# Patient Record
Sex: Male | Born: 1955 | Race: White | Hispanic: No | Marital: Married | Smoking: Former smoker
Health system: Southern US, Community
[De-identification: ages and names within clinical notes are randomized; demographics above are authoritative.]

## PROBLEM LIST (undated history)

## (undated) DIAGNOSIS — T7500XA Unspecified effects of lightning, initial encounter: Secondary | ICD-10-CM

## (undated) DIAGNOSIS — Z86010 Personal history of colonic polyps: Secondary | ICD-10-CM

## (undated) DIAGNOSIS — K219 Gastro-esophageal reflux disease without esophagitis: Secondary | ICD-10-CM

## (undated) DIAGNOSIS — R7989 Other specified abnormal findings of blood chemistry: Secondary | ICD-10-CM

## (undated) DIAGNOSIS — K5731 Diverticulosis of large intestine without perforation or abscess with bleeding: Secondary | ICD-10-CM

## (undated) DIAGNOSIS — G8929 Other chronic pain: Secondary | ICD-10-CM

## (undated) DIAGNOSIS — R51 Headache: Secondary | ICD-10-CM

## (undated) DIAGNOSIS — L03011 Cellulitis of right finger: Secondary | ICD-10-CM

## (undated) DIAGNOSIS — L0390 Cellulitis, unspecified: Secondary | ICD-10-CM

## (undated) DIAGNOSIS — B9562 Methicillin resistant Staphylococcus aureus infection as the cause of diseases classified elsewhere: Secondary | ICD-10-CM

## (undated) DIAGNOSIS — K922 Gastrointestinal hemorrhage, unspecified: Secondary | ICD-10-CM

## (undated) DIAGNOSIS — M1990 Unspecified osteoarthritis, unspecified site: Secondary | ICD-10-CM

## (undated) DIAGNOSIS — M5030 Other cervical disc degeneration, unspecified cervical region: Secondary | ICD-10-CM

## (undated) DIAGNOSIS — I10 Essential (primary) hypertension: Secondary | ICD-10-CM

## (undated) DIAGNOSIS — R519 Headache, unspecified: Secondary | ICD-10-CM

## (undated) HISTORY — DX: Other specified abnormal findings of blood chemistry: R79.89

## (undated) HISTORY — DX: Gastro-esophageal reflux disease without esophagitis: K21.9

## (undated) HISTORY — DX: Other cervical disc degeneration, unspecified cervical region: M50.30

## (undated) HISTORY — DX: Diverticulosis of large intestine without perforation or abscess with bleeding: K57.31

## (undated) HISTORY — DX: Headache: R51

## (undated) HISTORY — DX: Unspecified effects of lightning, initial encounter: T75.00XA

## (undated) HISTORY — DX: Headache, unspecified: R51.9

## (undated) HISTORY — DX: Other chronic pain: G89.29

## (undated) HISTORY — DX: Gastrointestinal hemorrhage, unspecified: K92.2

## (undated) HISTORY — DX: Personal history of colonic polyps: Z86.010

## (undated) HISTORY — DX: Cellulitis of right finger: L03.011

---

## 2009-03-15 DIAGNOSIS — M5030 Other cervical disc degeneration, unspecified cervical region: Secondary | ICD-10-CM

## 2009-03-15 HISTORY — DX: Other cervical disc degeneration, unspecified cervical region: M50.30

## 2009-04-21 ENCOUNTER — Encounter: Admission: RE | Admit: 2009-04-21 | Discharge: 2009-04-21 | Payer: Self-pay | Admitting: Family Medicine

## 2009-10-25 ENCOUNTER — Emergency Department (HOSPITAL_COMMUNITY): Admission: EM | Admit: 2009-10-25 | Discharge: 2009-10-25 | Payer: Self-pay | Admitting: Emergency Medicine

## 2010-02-20 ENCOUNTER — Emergency Department (HOSPITAL_COMMUNITY)
Admission: EM | Admit: 2010-02-20 | Discharge: 2010-02-20 | Payer: Self-pay | Source: Home / Self Care | Admitting: Emergency Medicine

## 2010-08-24 ENCOUNTER — Encounter: Payer: Self-pay | Admitting: Internal Medicine

## 2010-08-24 ENCOUNTER — Ambulatory Visit (INDEPENDENT_AMBULATORY_CARE_PROVIDER_SITE_OTHER): Payer: 59 | Admitting: Internal Medicine

## 2010-08-24 VITALS — BP 138/76 | HR 84 | Ht 69.0 in | Wt 164.0 lb

## 2010-08-24 DIAGNOSIS — L290 Pruritus ani: Secondary | ICD-10-CM

## 2010-08-24 DIAGNOSIS — R1319 Other dysphagia: Secondary | ICD-10-CM

## 2010-08-24 DIAGNOSIS — K219 Gastro-esophageal reflux disease without esophagitis: Secondary | ICD-10-CM

## 2010-08-24 DIAGNOSIS — Z1211 Encounter for screening for malignant neoplasm of colon: Secondary | ICD-10-CM

## 2010-08-24 MED ORDER — PEG-KCL-NACL-NASULF-NA ASC-C 100 G PO SOLR: 1.0000 | Freq: Once | ORAL | Status: DC

## 2010-08-24 NOTE — Progress Notes (Signed)
  Subjective:    Patient ID: John Johnston, male    DOB: Dec 15, 1955, 55 y.o.   MRN: [id number]  HPI 55 yo married white man here with dysphagia and suspected GERD. On Prilosec OTC bid x 2 years since brother had same issues with heartburn and was prescribed PPI. Has intermittent solid dysphagia, epigastric sticking point and waits it out until it passes but has regurgitated. Dysphagia x months No heartburn on prilosec Also has intermittent hoarseness hemorrhods x years, itching mainly  Here with wife -ICU RN and I care for her dad also  Review of Systems  HENT: Positive for hearing loss.   Musculoskeletal: Positive for back pain.  Sleep is disturbed by tinnitus   All other ROS negative or as per HPI Objective:   Physical Exam  Constitutional: He is oriented to person, place, and time. He appears well-developed and well-nourished. No distress.  HENT:  Head: Normocephalic.  Mouth/Throat: Oropharynx is clear and moist. No oropharyngeal exudate.       Partial dentures  Eyes: Conjunctivae are normal. Pupils are equal, round, and reactive to light. No scleral icterus.  Neck: Normal range of motion. Neck supple. No tracheal deviation present. No thyromegaly present.  Cardiovascular: Normal rate and regular rhythm.  Exam reveals no gallop and no friction rub.   No murmur heard. Pulmonary/Chest: Effort normal and breath sounds normal.  Abdominal: Soft. Bowel sounds are normal. He exhibits no distension and no mass. There is no tenderness.       No HSM, BS +  Genitourinary:       Rectal deferred  Lymphadenopathy:    He has no cervical adenopathy.  Neurological: He is alert and oriented to person, place, and time.  Skin: Skin is warm and dry.       Tanned sun-exposed areas  Psychiatric: He has a normal mood and affect. His behavior is normal.          Assessment & Plan:

## 2010-08-24 NOTE — Assessment & Plan Note (Signed)
Average risk Colonoscopy Risks/benefits discussed.

## 2010-08-24 NOTE — Assessment & Plan Note (Signed)
Probably hemorrhoids. Will examine at colonoscopy. Hygiene and pramoxine advised.

## 2010-08-24 NOTE — Assessment & Plan Note (Signed)
History compatible - heartburn x years, none on Prilosec. Now with dysphagia so needs EGD.

## 2010-08-24 NOTE — Assessment & Plan Note (Signed)
Months of intermittent solid dysphagia. EGD and likely dilation. Risks and benefits explained.

## 2010-08-24 NOTE — Patient Instructions (Addendum)
Try a lotion or cream with pramoxine for the anal itching in addition to the drying techniques using a hair dryer as discussed. You have been scheduled for and Endoscopy/Colonoscopy, with separate instructions given. Pick up your prep kit from your pharmacy.

## 2010-09-22 ENCOUNTER — Ambulatory Visit (AMBULATORY_SURGERY_CENTER): Payer: 59 | Admitting: Internal Medicine

## 2010-09-22 ENCOUNTER — Encounter: Payer: Self-pay | Admitting: Internal Medicine

## 2010-09-22 ENCOUNTER — Encounter: Payer: 59 | Admitting: Internal Medicine

## 2010-09-22 DIAGNOSIS — Z860101 Personal history of adenomatous and serrated colon polyps: Secondary | ICD-10-CM

## 2010-09-22 DIAGNOSIS — Z1211 Encounter for screening for malignant neoplasm of colon: Secondary | ICD-10-CM

## 2010-09-22 DIAGNOSIS — D126 Benign neoplasm of colon, unspecified: Secondary | ICD-10-CM

## 2010-09-22 DIAGNOSIS — R1310 Dysphagia, unspecified: Secondary | ICD-10-CM

## 2010-09-22 DIAGNOSIS — K649 Unspecified hemorrhoids: Secondary | ICD-10-CM

## 2010-09-22 DIAGNOSIS — K5730 Diverticulosis of large intestine without perforation or abscess without bleeding: Secondary | ICD-10-CM

## 2010-09-22 DIAGNOSIS — R1319 Other dysphagia: Secondary | ICD-10-CM

## 2010-09-22 DIAGNOSIS — K2970 Gastritis, unspecified, without bleeding: Secondary | ICD-10-CM

## 2010-09-22 DIAGNOSIS — K2990 Gastroduodenitis, unspecified, without bleeding: Secondary | ICD-10-CM

## 2010-09-22 DIAGNOSIS — Z86010 Personal history of colonic polyps: Secondary | ICD-10-CM

## 2010-09-22 HISTORY — PX: COLONOSCOPY W/ POLYPECTOMY: SHX1380

## 2010-09-22 HISTORY — DX: Personal history of adenomatous and serrated colon polyps: Z86.0101

## 2010-09-22 HISTORY — PX: UPPER GASTROINTESTINAL ENDOSCOPY: SHX188

## 2010-09-22 HISTORY — DX: Personal history of colonic polyps: Z86.010

## 2010-09-22 MED ORDER — SODIUM CHLORIDE 0.9 % IV SOLN: 500.0000 mL | INTRAVENOUS | Status: DC

## 2010-09-22 NOTE — Patient Instructions (Addendum)
Please read over all discharge instructions given to you by recovery room nurse.  Follow dilation diet until 09-23-10 in am.  Stay on prilosec. If persistent swallowing problems continues please follow up with Dr. Leone Payor.   If you have any problems please call us at [phone]. Thank you

## 2010-09-23 ENCOUNTER — Telehealth: Payer: Self-pay | Admitting: *Deleted

## 2010-09-23 NOTE — Telephone Encounter (Signed)

## 2010-10-01 ENCOUNTER — Encounter: Payer: Self-pay | Admitting: Internal Medicine

## 2010-10-01 NOTE — Progress Notes (Signed)
Quick Note:  2 diminutive adenomas Diverticulosis Int/ext hemorrhoids  Colon recall 2017 ______

## 2010-11-11 ENCOUNTER — Encounter: Payer: Self-pay | Admitting: Internal Medicine

## 2010-11-11 ENCOUNTER — Ambulatory Visit (INDEPENDENT_AMBULATORY_CARE_PROVIDER_SITE_OTHER): Payer: 59 | Admitting: Internal Medicine

## 2010-11-11 ENCOUNTER — Other Ambulatory Visit (INDEPENDENT_AMBULATORY_CARE_PROVIDER_SITE_OTHER): Payer: 59

## 2010-11-11 VITALS — BP 148/82 | HR 67 | Temp 97.2°F | Wt 167.0 lb

## 2010-11-11 DIAGNOSIS — Z136 Encounter for screening for cardiovascular disorders: Secondary | ICD-10-CM

## 2010-11-11 DIAGNOSIS — H539 Unspecified visual disturbance: Secondary | ICD-10-CM

## 2010-11-11 DIAGNOSIS — R42 Dizziness and giddiness: Secondary | ICD-10-CM

## 2010-11-11 DIAGNOSIS — H9190 Unspecified hearing loss, unspecified ear: Secondary | ICD-10-CM

## 2010-11-11 LAB — CBC WITH DIFFERENTIAL/PLATELET
Basophils Relative: 0.7 % (ref 0.0–3.0)
Eosinophils Absolute: 0.2 10*3/uL (ref 0.0–0.7)
Hemoglobin: 14.3 g/dL (ref 13.0–17.0)
Lymphs Abs: 1.3 10*3/uL (ref 0.7–4.0)
MCHC: 33.9 g/dL (ref 30.0–36.0)
MCV: 90.9 fl (ref 78.0–100.0)
Monocytes Absolute: 0.6 10*3/uL (ref 0.1–1.0)
Neutro Abs: 4.3 10*3/uL (ref 1.4–7.7)
RBC: 4.62 Mil/uL (ref 4.22–5.81)

## 2010-11-11 LAB — COMPREHENSIVE METABOLIC PANEL
ALT: 36 U/L (ref 0–53)
Albumin: 4.2 g/dL (ref 3.5–5.2)
BUN: 13 mg/dL (ref 6–23)
Creatinine, Ser: 1 mg/dL (ref 0.4–1.5)
GFR: 84.74 mL/min (ref >60.00)
Total Protein: 7.3 g/dL (ref 6.0–8.3)

## 2010-11-11 LAB — TSH: TSH: 1.06 u[IU]/mL (ref 0.35–5.50)

## 2010-11-11 NOTE — Patient Instructions (Signed)
Vertigo    (Dizziness)  Vertigo is a feeling that you are unsteady or dizzy. You may feel that you or things around you are moving. Vertigo causes a spinning feeling. It can make you feel off balance or may give you a whirling feeling. A change in your position can make it worse. Resting can make it better.    HOME CARE   Rest in bed.    Drink clear liquids.    Take medicine to lessen dizziness, nausea (feeling sick to your stomach), and vomiting (throwing up).    Avoid alcohol, tranquilizers, nicotine, caffeine and street drugs.   CAUSES   An infection of the inner ear.    An unusual migraine headache could also be a cause.   Other causes include:   Low blood pressure.    Low blood sugar.      Being upset.     Narrow blood vessels in the brain.    Nerve and heart problems.      Middle ear problems      GET HELP RIGHT AWAY IF:   Your vertigo gets worse.    You have an earache, ear drainage or hearing loss.    You have a bad headache, blurred or double vision, or trouble walking.    You faint or have extreme weakness, chest pain or rapid heart beat (palpitations).    You get a fever or throw up continuously.    You get numb or weak limbs.   Document Released: 12/09/2007 Document Re-Released: 12/26/2008  [employer] Patient Information 2011 [employer].

## 2010-11-11 NOTE — Assessment & Plan Note (Signed)
I will check labs to look for metabolic causes, I also think he should have an MRI of the brain done to look for acoustic neuroma and other central lesions

## 2010-11-11 NOTE — Progress Notes (Signed)
Subjective:    Patient ID: John Johnston, male    DOB: [birth year], 55 y.o.   MRN: [id number]  HPI New to me he complains of intermittent dizziness for 4 years.    Review of Systems  Constitutional: Negative for fever, chills, diaphoresis, activity change, appetite change, fatigue and unexpected weight change.  HENT: Positive for hearing loss and tinnitus. Negative for ear pain, congestion, sore throat, facial swelling, rhinorrhea, sneezing, drooling, mouth sores, trouble swallowing, neck pain, neck stiffness, dental problem, voice change, postnasal drip, sinus pressure and ear discharge.   Eyes: Positive for visual disturbance (episodes of blurred vision in both eyes). Negative for photophobia, pain, discharge, redness and itching.  Cardiovascular: Negative for chest pain, palpitations and leg swelling.  Gastrointestinal: Positive for nausea. Negative for vomiting, abdominal pain, diarrhea, constipation, blood in stool, abdominal distention and anal bleeding.  Genitourinary: Negative for dysuria, urgency, frequency, hematuria, flank pain, decreased urine volume, enuresis and difficulty urinating.  Musculoskeletal: Negative for myalgias, back pain, joint swelling, arthralgias and gait problem.  Skin: Negative for color change, pallor, rash and wound.  Neurological: Positive for dizziness and headaches. Negative for tremors, seizures, syncope, facial asymmetry, speech difficulty, weakness, light-headedness and numbness.  Hematological: Negative for adenopathy. Does not bruise/bleed easily.  Psychiatric/Behavioral: Positive for sleep disturbance (EMA). Negative for suicidal ideas, hallucinations, behavioral problems, confusion, self-injury, dysphoric mood, decreased concentration and agitation. The patient is not nervous/anxious and is not hyperactive.        Objective:   Physical Exam  Vitals reviewed. Constitutional: He is oriented to person, place, and time. He appears well-developed and  well-nourished. No distress.  HENT:  Head: Normocephalic and atraumatic.  Right Ear: External ear normal.  Left Ear: External ear normal.  Nose: Nose normal.  Mouth/Throat: No oropharyngeal exudate.  Eyes: Conjunctivae and EOM are normal. Pupils are equal, round, and reactive to light. Right eye exhibits no discharge. Left eye exhibits no discharge. No scleral icterus.  Neck: Normal range of motion. Neck supple. No JVD present. No tracheal deviation present. No thyromegaly present.  Cardiovascular: Normal rate, regular rhythm, normal heart sounds and intact distal pulses.  Exam reveals no gallop and no friction rub.   No murmur heard. Pulmonary/Chest: Effort normal and breath sounds normal. No stridor. No respiratory distress. He has no wheezes. He has no rales. He exhibits no tenderness.  Abdominal: Soft. Bowel sounds are normal. He exhibits no distension and no mass. There is no tenderness. There is no rebound and no guarding.  Musculoskeletal: Normal range of motion. He exhibits no edema and no tenderness.  Lymphadenopathy:    He has no cervical adenopathy.  Neurological: He is alert and oriented to person, place, and time. He has normal strength. He displays no atrophy, no tremor and normal reflexes. No cranial nerve deficit or sensory deficit. He exhibits normal muscle tone. He displays a negative Romberg sign. He displays no seizure activity. Coordination and gait normal. He displays no Babinski's sign on the right side. He displays no Babinski's sign on the left side.  Reflex Scores:      Tricep reflexes are 0 on the right side and 0 on the left side.      Bicep reflexes are 0 on the right side and 0 on the left side.      Brachioradialis reflexes are 0 on the right side and 0 on the left side.      Patellar reflexes are 1+ on the right side and 1+ on the left side.  Achilles reflexes are 1+ on the right side and 1+ on the left side. Skin: Skin is warm and dry. No rash noted. He is  not diaphoretic. No erythema. No pallor.  Psychiatric: He has a normal mood and affect. His behavior is normal. Judgment and thought content normal.          Assessment & Plan:

## 2010-11-11 NOTE — Assessment & Plan Note (Signed)
I think he should have an MRI done to look for CNS lesions

## 2010-11-11 NOTE — Assessment & Plan Note (Signed)
He needs a hearing evaluation

## 2010-11-12 NOTE — Assessment & Plan Note (Signed)
I have asked him to have his hearing tested

## 2010-11-18 ENCOUNTER — Ambulatory Visit (HOSPITAL_COMMUNITY)
Admission: RE | Admit: 2010-11-18 | Discharge: 2010-11-18 | Disposition: A | Discharge status: Home / Self Care | Payer: 59 | Source: Ambulatory Visit | Attending: Internal Medicine | Admitting: Internal Medicine

## 2010-11-18 ENCOUNTER — Other Ambulatory Visit: Payer: Self-pay | Admitting: Internal Medicine

## 2010-11-18 DIAGNOSIS — R42 Dizziness and giddiness: Secondary | ICD-10-CM | POA: Insufficient documentation

## 2010-11-18 DIAGNOSIS — H539 Unspecified visual disturbance: Secondary | ICD-10-CM

## 2010-11-18 DIAGNOSIS — Z1389 Encounter for screening for other disorder: Secondary | ICD-10-CM | POA: Insufficient documentation

## 2010-11-18 DIAGNOSIS — J320 Chronic maxillary sinusitis: Secondary | ICD-10-CM | POA: Insufficient documentation

## 2010-11-18 DIAGNOSIS — H538 Other visual disturbances: Secondary | ICD-10-CM | POA: Insufficient documentation

## 2010-11-18 MED ORDER — DIAZEPAM 5 MG PO TABS: 5.0000 mg | ORAL_TABLET | Freq: Three times a day (TID) | ORAL | Status: AC | PRN

## 2010-11-18 NOTE — Progress Notes (Signed)
Addended by: Etta Grandchild on: 11/18/2010 09:01 AM   Modules accepted: Orders

## 2010-12-24 ENCOUNTER — Telehealth: Payer: Self-pay | Admitting: *Deleted

## 2010-12-24 NOTE — Telephone Encounter (Signed)
Pt continues to c/o dizziness off and on. MRI, vision & hearing tests were negative. She would like patient to try OTC meclizine. Advised her to come in for re-eval w/change in symptoms.

## 2011-01-22 ENCOUNTER — Encounter (HOSPITAL_COMMUNITY): Payer: Self-pay | Admitting: Cardiology

## 2011-01-22 ENCOUNTER — Emergency Department (INDEPENDENT_AMBULATORY_CARE_PROVIDER_SITE_OTHER): Payer: 59

## 2011-01-22 ENCOUNTER — Emergency Department (HOSPITAL_COMMUNITY)
Admission: EM | Admit: 2011-01-22 | Discharge: 2011-01-22 | Disposition: A | Discharge status: Home / Self Care | Payer: 59 | Source: Home / Self Care

## 2011-01-22 DIAGNOSIS — S2239XA Fracture of one rib, unspecified side, initial encounter for closed fracture: Secondary | ICD-10-CM

## 2011-01-22 DIAGNOSIS — J9819 Other pulmonary collapse: Secondary | ICD-10-CM

## 2011-01-22 DIAGNOSIS — J9811 Atelectasis: Secondary | ICD-10-CM

## 2011-01-22 MED ORDER — IBUPROFEN 800 MG PO TABS: 800.0000 mg | ORAL_TABLET | Freq: Three times a day (TID) | ORAL | Status: AC

## 2011-01-22 MED ORDER — HYDROCODONE-ACETAMINOPHEN 5-325 MG PO TABS
2.0000 | ORAL_TABLET | Freq: Once | ORAL | Status: AC
  Administered 2011-01-22: 2 via ORAL

## 2011-01-22 MED ORDER — OXYCODONE-ACETAMINOPHEN 7.5-500 MG PO TABS
1.0000 | ORAL_TABLET | ORAL | Status: AC | PRN
Start: 2011-01-22 — End: 2011-02-01

## 2011-01-22 MED ORDER — HYDROCODONE-ACETAMINOPHEN 5-325 MG PO TABS
ORAL_TABLET | ORAL | Status: AC
  Administered 2011-01-22: 2 via ORAL
  Filled 2011-01-22: qty 2

## 2011-01-22 MED ORDER — DIAZEPAM 5 MG PO TABS: 5.0000 mg | ORAL_TABLET | Freq: Two times a day (BID) | ORAL | Status: AC

## 2011-01-22 MED ORDER — HYDROCODONE-ACETAMINOPHEN 10-325 MG PO TABS: 1.0000 | ORAL_TABLET | Freq: Once | ORAL | Status: DC

## 2011-01-22 NOTE — ED Notes (Signed)
Pt fell off 6 foot ladder one week ago. Pain has gotten worse over the past 2 days with muscle spasms and pain. Pain extends into the right rib area. Pt has taken flexeril and hydrocodone with some relief. Pt has increased discomfort with deep breathing, coughing and sneezing. Denies fever. Breath sounds CTA.

## 2011-01-22 NOTE — ED Provider Notes (Signed)
History     CSN: [id number] Arrival date & time: 01/22/2011 10:51 AM   First MD Initiated Contact with Patient 01/22/11 1212      Chief Complaint  Patient presents with  . Flank Pain  . Back Pain    (Consider location/radiation/quality/duration/timing/severity/associated sxs/prior treatment) Patient is a 55 y.o. male presenting with flank pain. The history is provided by the patient and the spouse.  Flank Pain This is a new problem. Episode onset: s/p fall from ladder 6 feet high 1 week ago; has had right flank pain since, was improving but now worsening in last 2 days. The problem occurs constantly. Pertinent negatives include no chest pain, no abdominal pain and no shortness of breath. Associated symptoms comments: Associated with muscle spasms, no irradiation.  No fever cough or congestion, no shortness of breath but pain with deep breathing. No hematuria or dysuria.. The symptoms are aggravated by coughing (sleeping over right side, also worse with deep inspiration). The symptoms are relieved by narcotics (mild improvement with flexeril). Treatments tried: flexeril and hydrocodone. The treatment provided mild relief.    Past Medical History  Diagnosis Date  . Chronic headaches   . GERD (gastroesophageal reflux disease)   . DDD (degenerative disc disease), cervical 2011    Past Surgical History  Procedure Date  . Upper gastrointestinal endoscopy 09/22/2010    55 Fr Maloney dilation for dysphagia, gastritis  . Colonoscopy w/ polypectomy 09/22/2010    2 diminutive adenomas, diverticulosis, ext/int hemorrhoids    Family History  Problem Relation Age of Onset  . Breast cancer Sister   . Heart disease Father   . Stroke Mother   . Heart disease Brother   . Heart attack Brother     History  Substance Use Topics  . Smoking status: Former Smoker -- 1.0 packs/day for 30 years    Quit date: 08/11/2010  . Smokeless tobacco: Never Used  . Alcohol Use: No      Review of  Systems  Constitutional: Positive for activity change. Negative for fever, chills and appetite change.  HENT: Negative for congestion.   Respiratory: Negative for cough, chest tightness, shortness of breath and wheezing.   Cardiovascular: Negative for chest pain, palpitations and leg swelling.  Gastrointestinal: Negative for abdominal pain.  Genitourinary: Positive for flank pain. Negative for dysuria, frequency and hematuria.  Musculoskeletal: Positive for myalgias and back pain.    Allergies  Review of patient's allergies indicates no known allergies.  Home Medications   Current Outpatient Rx  Name Route Sig Dispense Refill  . DIAZEPAM 5 MG PO TABS Oral Take 1 tablet (5 mg total) by mouth every 8 (eight) hours as needed for anxiety or sleep. 5 tablet 0  . OMEPRAZOLE MAGNESIUM 20 MG PO TBEC Oral Take 20 mg by mouth 2 (two) times daily.     Marland Kitchen DIAZEPAM 5 MG PO TABS Oral Take 1 tablet (5 mg total) by mouth 2 (two) times daily. 10 tablet 0  . IBUPROFEN 800 MG PO TABS Oral Take 1 tablet (800 mg total) by mouth 3 (three) times daily. 21 tablet 0  . OXYCODONE-ACETAMINOPHEN 7.5-500 MG PO TABS Oral Take 1 tablet by mouth every 4 (four) hours as needed for pain. 30 tablet 0  . TADALAFIL 20 MG PO TABS Oral Take 10 mg by mouth daily as needed.        BP 145/78  Pulse 76  Temp(Src) 98.4 F (36.9 C) (Oral)  Resp 16  SpO2 99%  Physical Exam  Nursing note and vitals reviewed. Constitutional: He is oriented to person, place, and time. He appears well-developed and well-nourished.       uncomfortable with certain movements and if touched in right flank area.  Cardiovascular: Normal rate and regular rhythm.   Pulmonary/Chest: No respiratory distress. He has rales. He exhibits tenderness.       Chest wall tenderness in right lateral lower chest. Also superficial antalgic breathing. Bilateral fine crackles more on right base than left. Normal breath sounds above bases, no wheezing, No tachypnea or  orthopnea.   Abdominal: Soft. Bowel sounds are normal. He exhibits no distension. There is no tenderness. There is no rebound and no guarding.  Musculoskeletal:       Muscle contraction and tenderness to palpation in right thoraxic and lumbar paravertebral area. Also increased tone and tenderness to palpation in right costal margine.  Neurological: He is alert and oriented to person, place, and time.  Skin: Skin is intact. Bruising and ecchymosis noted.       ED Course  Procedures (including critical care time)  Labs Reviewed - No data to display Dg Ribs Unilateral W/chest Right  01/22/2011  *RADIOLOGY REPORT*  Clinical Data: Fall, right flank pain.  RIGHT RIBS AND CHEST - 3+ VIEW  Comparison: None.  Findings: There is a nondisplaced fracture noted through the right anterior/lateral ninth rib.  No pneumothorax.  There is bibasilar atelectasis.  Heart is normal size.  No effusions.  IMPRESSION: Nondisplaced right anterior/lateral ninth rib fracture.  Bibasilar atelectasis.  Original Report Authenticated By: Cyndie Chime, M.D.     1. Closed rib fracture   2. Atelectasis       MDM  Pain management with Norco, ibuprofen and diazepam for muscle spasms. Rib belt. Job restrictions. Incentive spirometry.        Allana Shrestha Moreno-Coll 01/23/11 1518

## 2012-02-16 ENCOUNTER — Telehealth: Payer: Self-pay | Admitting: Internal Medicine

## 2012-02-16 NOTE — Telephone Encounter (Signed)
Patient is scheduled per Dr. Marvell Fuller instructions for 03/03/12 4:00, he will come for a pre-visit on 02/23/12

## 2012-02-23 ENCOUNTER — Ambulatory Visit (AMBULATORY_SURGERY_CENTER): Payer: 59 | Admitting: *Deleted

## 2012-02-23 VITALS — Ht 70.0 in | Wt 168.0 lb

## 2012-02-23 DIAGNOSIS — R1310 Dysphagia, unspecified: Secondary | ICD-10-CM

## 2012-03-03 ENCOUNTER — Encounter: Payer: Self-pay | Admitting: Internal Medicine

## 2012-03-03 ENCOUNTER — Ambulatory Visit (AMBULATORY_SURGERY_CENTER): Payer: 59 | Admitting: Internal Medicine

## 2012-03-03 VITALS — BP 132/86 | HR 59 | Temp 98.2°F | Resp 20 | Ht 71.0 in | Wt 168.0 lb

## 2012-03-03 DIAGNOSIS — K219 Gastro-esophageal reflux disease without esophagitis: Secondary | ICD-10-CM

## 2012-03-03 DIAGNOSIS — K209 Esophagitis, unspecified without bleeding: Secondary | ICD-10-CM

## 2012-03-03 DIAGNOSIS — R1310 Dysphagia, unspecified: Secondary | ICD-10-CM

## 2012-03-03 MED ORDER — ESOMEPRAZOLE MAGNESIUM 40 MG PO CPDR: 40.0000 mg | DELAYED_RELEASE_CAPSULE | Freq: Every day | ORAL | Status: DC

## 2012-03-03 MED ORDER — SODIUM CHLORIDE 0.9 % IV SOLN: 500.0000 mL | INTRAVENOUS | Status: DC

## 2012-03-03 NOTE — Op Note (Signed)
[facility] [address] [place], [address]   ENDOSCOPY PROCEDURE REPORT  PATIENT: John, Johnston  MR#: [id number] BIRTHDATE: 02/03/1956 , 56  yrs. old GENDER: Male ENDOSCOPIST: Iva Boop, MD, [facility] PROCEDURE DATE:  03/03/2012 PROCEDURE:  EGD w/ biopsy and Maloney dilation of esophagus ASA CLASS:     Class II INDICATIONS:  Dysphagia. MEDICATIONS: propofol (Diprivan) 250mg  IV, MAC sedation, administered by CRNA, and These medications were titrated to patient response per physician's verbal order TOPICAL ANESTHETIC: none  DESCRIPTION OF PROCEDURE: After the risks benefits and alternatives of the procedure were thoroughly explained, informed consent was obtained.  The LB GIF-H180 [id number] and LB GIF-H180 [id number] endoscope was introduced through the mouth and advanced to the second portion of the duodenum. Without limitations.  The instrument was slowly withdrawn as the mucosa was fully examined.        ESOPHAGUS: There was evidence of suspected Barrett's esophagus at the gastroesophageal junction. One ? tongue. Multiple biopsies were performed using cold forceps.  Sample sent for histology.   The esophagus was somewhat tortuous.  The remainder of the upper endoscopy exam was otherwise normal. Retroflexed views revealed no abnormalities.     The scope was then withdrawn from the patient, a 31 [place] Maloney dilator passed without difficulty or heme,  and the procedure completed.  COMPLICATIONS: There were no complications. ENDOSCOPIC IMPRESSION: 1.   There was evidence of suspected Barrett's esophagus; multiple biopsies 2.   The remainder of the upper endoscopy exam was otherwise normal -54 [place] Maloney dilator passed for dysphagia  RECOMMENDATIONS: Clear liquids until 630  , then soft foods rest of day.  Resume prior diet tomorrow. Will notify re: bx results/plans A manometry of esophagus would probably be next step, ? change PPI   eSigned:   Iva Boop, MD, [facility] 03/03/2012 5:17 PM  CC:The Patient

## 2012-03-03 NOTE — Progress Notes (Signed)
1704 drowsy but arousable, good SR's report to [employer]

## 2012-03-03 NOTE — Patient Instructions (Addendum)
The area where the esophagus joins the stomach looked slightly abnormal and I biopsied it - might have inflammation or Barrett's esophagus. I will send a letter or call about these results. You might need to change acid blocking medicine but you are taking a high dose already.  The remainder of the exam was ok, but esophagus looked tortuous - ? If not squeezing properly. If you still have problems will need to measure the muscle function of the esophagus - manometry.  Thank you for choosing me and [facility].  Iva Boop, MD, FACG   YOU HAD AN ENDOSCOPIC PROCEDURE TODAY AT THE [facility]: Refer to the procedure report that was given to you for any specific questions about what was found during the examination.  If the procedure report does not answer your questions, please call your gastroenterologist to clarify.  If you requested that your care partner not be given the details of your procedure findings, then the procedure report has been included in a sealed envelope for you to review at your convenience later.  YOU SHOULD EXPECT: Some feelings of bloating in the abdomen. Passage of more gas than usual.  Walking can help get rid of the air that was put into your GI tract during the procedure and reduce the bloating. If you had a lower endoscopy (such as a colonoscopy or flexible sigmoidoscopy) you may notice spotting of blood in your stool or on the toilet paper. If you underwent a bowel prep for your procedure, then you may not have a normal bowel movement for a few days.  DIET: Your first meal following the procedure should be a light meal and then it is ok to progress to your normal diet.  A half-sandwich or bowl of soup is an example of a good first meal.  Heavy or fried foods are harder to digest and may make you feel nauseous or bloated.  Likewise meals heavy in dairy and vegetables can cause extra gas to form and this can also increase the bloating.  Drink  plenty of fluids but you should avoid alcoholic beverages for 24 hours.  ACTIVITY: Your care partner should take you home directly after the procedure.  You should plan to take it easy, moving slowly for the rest of the day.  You can resume normal activity the day after the procedure however you should NOT DRIVE or use heavy machinery for 24 hours (because of the sedation medicines used during the test).    SYMPTOMS TO REPORT IMMEDIATELY: A gastroenterologist can be reached at any hour.  During normal business hours, 8:30 AM to 5:00 PM Monday through Friday, call [phone].  After hours and on weekends, please call the GI answering service at [phone] who will take a message and have the physician on call contact you.   Following lower endoscopy (colonoscopy or flexible sigmoidoscopy):  Excessive amounts of blood in the stool  Significant tenderness or worsening of abdominal pains  Swelling of the abdomen that is new, acute  Fever of 100F or higher  Following upper endoscopy (EGD)  Vomiting of blood or coffee ground material  New chest pain or pain under the shoulder blades  Painful or persistently difficult swallowing  New shortness of breath  Fever of 100F or higher  Black, tarry-looking stools  FOLLOW UP: If any biopsies were taken you will be contacted by phone or by letter within the next 1-3 weeks.  Call your gastroenterologist if you have not heard  about the biopsies in 3 weeks.  Our staff will call the home number listed on your records the next business day following your procedure to check on you and address any questions or concerns that you may have at that time regarding the information given to you following your procedure. This is a courtesy call and so if there is no answer at the home number and we have not heard from you through the emergency physician on call, we will assume that you have returned to your regular daily activities without  incident.  SIGNATURES/CONFIDENTIALITY: You and/or your care partner have signed paperwork which will be entered into your electronic medical record.  These signatures attest to the fact that that the information above on your After Visit Summary has been reviewed and is understood.  Full responsibility of the confidentiality of this discharge information lies with you and/or your care-partner.

## 2012-03-03 NOTE — Progress Notes (Signed)
Patient did not experience any of the following events: a burn prior to discharge; a fall within the facility; wrong site/side/patient/procedure/implant event; or a hospital transfer or hospital admission upon discharge from the facility. (G8907) Patient did not have preoperative order for IV antibiotic SSI prophylaxis. (G8918)  

## 2012-03-06 ENCOUNTER — Telehealth: Payer: Self-pay | Admitting: *Deleted

## 2012-03-06 NOTE — Telephone Encounter (Signed)
Left message that we called for f/u 

## 2012-03-07 ENCOUNTER — Other Ambulatory Visit: Payer: Self-pay | Admitting: *Deleted

## 2012-03-07 DIAGNOSIS — R1310 Dysphagia, unspecified: Secondary | ICD-10-CM

## 2012-03-07 MED ORDER — ESOMEPRAZOLE MAGNESIUM 40 MG PO CPDR: 40.0000 mg | DELAYED_RELEASE_CAPSULE | Freq: Every day | ORAL | Status: DC

## 2012-03-13 ENCOUNTER — Encounter: Payer: Self-pay | Admitting: Internal Medicine

## 2012-03-13 NOTE — Progress Notes (Signed)
Quick Note:  Inflammation No intestinal metaplasia/Barrett's PPI changed to Nexium ______

## 2012-10-23 ENCOUNTER — Encounter (HOSPITAL_COMMUNITY): Payer: Self-pay

## 2012-10-23 ENCOUNTER — Other Ambulatory Visit (HOSPITAL_COMMUNITY): Payer: 59

## 2012-10-23 ENCOUNTER — Emergency Department (HOSPITAL_COMMUNITY)
Admission: EM | Admit: 2012-10-23 | Discharge: 2012-10-23 | Disposition: A | Discharge status: Home / Self Care | Payer: 59 | Source: Home / Self Care

## 2012-10-23 ENCOUNTER — Emergency Department (INDEPENDENT_AMBULATORY_CARE_PROVIDER_SITE_OTHER): Payer: 59

## 2012-10-23 DIAGNOSIS — R0601 Orthopnea: Secondary | ICD-10-CM

## 2012-10-23 DIAGNOSIS — R0789 Other chest pain: Secondary | ICD-10-CM

## 2012-10-23 LAB — TSH: TSH: 1.119 u[IU]/mL (ref 0.350–4.500)

## 2012-10-23 LAB — COMPREHENSIVE METABOLIC PANEL
ALT: 26 U/L (ref 0–53)
AST: 22 U/L (ref 0–37)
CO2: 26 mEq/L (ref 19–32)
Chloride: 101 mEq/L (ref 96–112)
GFR calc non Af Amer: >90 mL/min (ref >90)
Sodium: 139 mEq/L (ref 135–145)
Total Bilirubin: 0.4 mg/dL (ref 0.3–1.2)

## 2012-10-23 LAB — CBC WITH DIFFERENTIAL/PLATELET
Basophils Absolute: 0.1 10*3/uL (ref 0.0–0.1)
HCT: 42.2 % (ref 39.0–52.0)
Lymphocytes Relative: 20 % (ref 12–46)
Neutro Abs: 3.2 10*3/uL (ref 1.7–7.7)
Platelets: 226 10*3/uL (ref 150–400)
RDW: 12.5 % (ref 11.5–15.5)
WBC: 4.7 10*3/uL (ref 4.0–10.5)

## 2012-10-23 LAB — SEDIMENTATION RATE: Sed Rate: 5 mm/hr (ref 0–16)

## 2012-10-23 LAB — T4, FREE: Free T4: 1.24 ng/dL (ref 0.80–1.80)

## 2012-10-23 LAB — C-REACTIVE PROTEIN: CRP: <0.5 mg/dL — ABNORMAL LOW (ref <0.60)

## 2012-10-23 MED ORDER — CETIRIZINE HCL 10 MG PO TABS: 10.0000 mg | ORAL_TABLET | Freq: Every day | ORAL | Status: DC

## 2012-10-23 MED ORDER — METHYLPREDNISOLONE 4 MG PO KIT: PACK | ORAL | Status: DC

## 2012-10-23 NOTE — ED Notes (Signed)
C/o 1 week + duration of cough, congestion, hoarseness; no rales, wheeze on ascultation

## 2012-10-23 NOTE — ED Provider Notes (Signed)
CSN: [id number]     Arrival date & time 10/23/12  0818 History     First MD Initiated Contact with Patient 10/23/12 [phone]     Chief Complaint  Patient presents with  . Cough   (Consider location/radiation/quality/duration/timing/severity/associated sxs/prior Treatment) HPI Comments: 57 year old male presents for evaluation of tightness in his chest for 2 weeks, a choking sensation in his throat when he lays down, shortness of breath with laying down, and his voice being hoarse. The symptoms have been gradually worsening over these 2 weeks. History taking over-the-counter cough and cold medicines including Mucinex and Sudafed but these have not helped. He admits to a history of anxiety as well as a long smoking history, having quit 28 months ago. He also has a history of GERD that has been controlled with Nexium, 40 mg daily. This tightness across his chest is not associated with exercise or any physical activity, nor is the choking sensation in his throat. Additionally, he admits to recent spell where he got lightheaded and almost passed out after blowing his nose very forcefully.  denies any fever, chills, night sweats, weight loss, abdominal pain, NVD, rash. He denies previous history of similar symptoms. He denies sick contacts with similar symptoms.   Past Medical History  Diagnosis Date  . Chronic headaches   . GERD (gastroesophageal reflux disease)   . DDD (degenerative disc disease), cervical 2011   Past Surgical History  Procedure Laterality Date  . Upper gastrointestinal endoscopy  09/22/2010    54 Fr Maloney dilation for dysphagia, gastritis  . Colonoscopy w/ polypectomy  09/22/2010    2 diminutive adenomas, diverticulosis, ext/int hemorrhoids   Family History  Problem Relation Age of Onset  . Breast cancer Sister   . Heart disease Father   . Stroke Mother   . Heart disease Brother   . Heart attack Brother   . Colon cancer Neg Hx   . Stomach cancer Neg Hx    History   Substance Use Topics  . Smoking status: Former Smoker -- 1.00 packs/day for 30 years    Quit date: 08/11/2010  . Smokeless tobacco: Never Used  . Alcohol Use: No    Review of Systems  Constitutional: Negative for fever, chills and fatigue.  HENT: Positive for congestion (chronic), sore throat (tightness), trouble swallowing and voice change (hoarse). Negative for neck pain and neck stiffness.   Eyes: Negative for visual disturbance.  Respiratory: Positive for cough, choking, chest tightness and shortness of breath (orthopnea).   Cardiovascular: Negative for chest pain, palpitations and leg swelling.  Gastrointestinal: Negative for nausea, vomiting, abdominal pain, diarrhea and constipation.  Genitourinary: Negative for dysuria, urgency, frequency and hematuria.  Musculoskeletal: Negative for myalgias and arthralgias.  Skin: Negative for rash.  Neurological: Positive for dizziness (with blowing nose). Negative for weakness and light-headedness.    Allergies  Review of patient's allergies indicates no known allergies.  Home Medications   Current Outpatient Rx  Name  Route  Sig  Dispense  Refill  . esomeprazole (NEXIUM) 40 MG capsule   Oral   Take 1 capsule (40 mg total) by mouth daily before breakfast.   30 capsule   11   . cetirizine (ZYRTEC) 10 MG tablet   Oral   Take 1 tablet (10 mg total) by mouth daily.   30 tablet   0   . ciprofloxacin (CIPRO) 500 MG tablet   Oral   Take 500 mg by mouth 2 (two) times daily.         Marland Kitchen  methylPREDNISolone (MEDROL DOSEPAK) 4 MG tablet      Use as directed   21 tablet   0   . tadalafil (CIALIS) 20 MG tablet   Oral   Take 10 mg by mouth daily as needed.            BP 159/78  Pulse 75  Temp(Src) 97.9 F (36.6 C) (Oral)  Resp 16  SpO2 100% Physical Exam  Nursing note and vitals reviewed. Constitutional: He is oriented to person, place, and time. He appears well-developed and well-nourished. No distress.  HENT:  Head:  Normocephalic and atraumatic.  Right Ear: External ear normal.  Left Ear: External ear normal.  Nose: Nose normal.  Mouth/Throat: Oropharynx is clear and moist. No oropharyngeal exudate.  Eyes: Conjunctivae and EOM are normal. Pupils are equal, round, and reactive to light.  Neck: Normal range of motion. Neck supple.  Cardiovascular: Normal rate, regular rhythm, normal heart sounds and intact distal pulses.  Exam reveals no gallop and no friction rub.   No murmur heard. Pulmonary/Chest: Effort normal. No respiratory distress. He has no wheezes. He has no rhonchi. He has no rales.  Lymphadenopathy:    He has no cervical adenopathy.  Neurological: He is alert and oriented to person, place, and time.  Skin: Skin is warm and dry. No rash noted. He is not diaphoretic.  Psychiatric: He has a normal mood and affect. Judgment normal.    ED Course   Procedures (including critical care time)  Labs Reviewed  CBC WITH DIFFERENTIAL - Abnormal; Notable for the following:    MCHC 36.7 (*)    All other components within normal limits  COMPREHENSIVE METABOLIC PANEL - Abnormal; Notable for the following:    Glucose, Bld 110 (*)    All other components within normal limits  C-REACTIVE PROTEIN - Abnormal; Notable for the following:    CRP <0.5 (*)    All other components within normal limits  SEDIMENTATION RATE  T4, FREE  TSH   Dg Chest 2 View  10/23/2012   *RADIOLOGY REPORT*  Clinical Data: Chest pain, shortness of breath  CHEST - 2 VIEW  Comparison: Chest radiograph 01/22/2011.  Findings: Stable cardiac and mediastinal contours.  No consolidative pulmonary opacities.  No pleural effusion or pneumothorax.  IMPRESSION: No acute cardiopulmonary process.   Original Report Authenticated By: Annia Belt, M.D   1. Feeling of chest tightness   2. Orthopnea     MDM  The exam today is normal. EKG and chest x-ray are both normal. We'll try treating for allergies, he will followup with cardiology for a  stress test as an outpatient.   Meds ordered this encounter  Medications  . cetirizine (ZYRTEC) 10 MG tablet    Sig: Take 1 tablet (10 mg total) by mouth daily.    Dispense:  30 tablet    Refill:  0  . methylPREDNISolone (MEDROL DOSEPAK) 4 MG tablet    Sig: Use as directed    Dispense:  21 tablet    Refill:  0     Graylon Good, PA-C 10/23/12 2123   All labs came back normal   Graylon Good, PA-C 10/23/12 2130

## 2012-10-26 NOTE — ED Provider Notes (Signed)
Medical screening examination/treatment/procedure(s) were performed by a resident physician or non-physician practitioner and as the supervising physician I was immediately available for consultation/collaboration.  Elric Tirado, MD   Beyonce Sawatzky S Jennell Janosik, MD 10/26/12 1447 

## 2013-10-30 ENCOUNTER — Encounter (HOSPITAL_COMMUNITY): Payer: Self-pay | Admitting: Emergency Medicine

## 2013-10-30 ENCOUNTER — Emergency Department (INDEPENDENT_AMBULATORY_CARE_PROVIDER_SITE_OTHER): Payer: 59

## 2013-10-30 ENCOUNTER — Emergency Department (HOSPITAL_COMMUNITY)
Admission: EM | Admit: 2013-10-30 | Discharge: 2013-10-30 | Disposition: A | Discharge status: Home / Self Care | Payer: 59 | Source: Home / Self Care

## 2013-10-30 DIAGNOSIS — L089 Local infection of the skin and subcutaneous tissue, unspecified: Secondary | ICD-10-CM

## 2013-10-30 DIAGNOSIS — Z23 Encounter for immunization: Secondary | ICD-10-CM

## 2013-10-30 HISTORY — DX: Cellulitis, unspecified: L03.90

## 2013-10-30 HISTORY — DX: Methicillin resistant Staphylococcus aureus infection as the cause of diseases classified elsewhere: B95.62

## 2013-10-30 MED ORDER — TETANUS-DIPHTH-ACELL PERTUSSIS 5-2.5-18.5 LF-MCG/0.5 IM SUSP
0.5000 mL | Freq: Once | INTRAMUSCULAR | Status: AC
  Administered 2013-10-30: 0.5 mL via INTRAMUSCULAR

## 2013-10-30 MED ORDER — CEPHALEXIN 500 MG PO CAPS: 500.0000 mg | ORAL_CAPSULE | Freq: Four times a day (QID) | ORAL | Status: DC

## 2013-10-30 MED ORDER — TETANUS-DIPHTH-ACELL PERTUSSIS 5-2.5-18.5 LF-MCG/0.5 IM SUSP
INTRAMUSCULAR | Status: AC
  Filled 2013-10-30: qty 0.5

## 2013-10-30 NOTE — ED Notes (Signed)
States he has had a problem w right  5 th finger for couple of days; no known injury, but squeezed it and got out pus. 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

## 2013-10-30 NOTE — ED Provider Notes (Signed)
CSN: [id number]     Arrival date & time 10/30/13  5573 History   First MD Initiated Contact with Patient 10/30/13 [phone]     Chief Complaint  Patient presents with  . Skin Problem   (Consider location/radiation/quality/duration/timing/severity/associated sxs/prior Treatment) HPI Comments: [place]. Next day notice a small pustule to the extensor surface of the right 5th digit proximal phalynx. Yesterday with a pustule in which he has been squeezing. Today with tenderness, erythema and swelling to the proximal phalynx. Unsure wether it was pricked by a fish hook, thorn or something else.    Past Medical History  Diagnosis Date  . Chronic headaches   . GERD (gastroesophageal reflux disease)   . DDD (degenerative disc disease), cervical 2011  . MRSA cellulitis    Past Surgical History  Procedure Laterality Date  . Upper gastrointestinal endoscopy  09/22/2010    54 Fr Maloney dilation for dysphagia, gastritis  . Colonoscopy w/ polypectomy  09/22/2010    2 diminutive adenomas, diverticulosis, ext/int hemorrhoids   Family History  Problem Relation Age of Onset  . Breast cancer Sister   . Heart disease Father   . Stroke Mother   . Heart disease Brother   . Heart attack Brother   . Colon cancer Neg Hx   . Stomach cancer Neg Hx    History  Substance Use Topics  . Smoking status: Former Smoker -- 1.00 packs/day for 30 years    Quit date: 08/11/2010  . Smokeless tobacco: Never Used  . Alcohol Use: No    Review of Systems  Constitutional: Negative.   Skin: Positive for color change and wound.    Allergies  Review of patient's allergies indicates no known allergies.  Home Medications   Prior to Admission medications   Medication Sig Start Date End Date Taking? Authorizing Provider  cephALEXin (KEFLEX) 500 MG capsule Take 1 capsule (500 mg total) by mouth 4 (four) times daily. 10/30/13   Janne Napoleon, NP  cetirizine (ZYRTEC) 10 MG tablet Take 1 tablet (10  mg total) by mouth daily. 10/23/12   Liam Graham, PA-C  esomeprazole (NEXIUM) 40 MG capsule Take 1 capsule (40 mg total) by mouth daily before breakfast. 03/07/12   Gatha Mayer, MD  tadalafil (CIALIS) 20 MG tablet Take 10 mg by mouth daily as needed.      Historical Provider, MD   BP 163/84  Pulse 60  Temp(Src) 98 F (36.7 C) (Oral)  Resp 18  SpO2 97% Physical Exam  Nursing note and vitals reviewed. Constitutional: He is oriented to person, place, and time. He appears well-developed and well-nourished. No distress.  Neck: Normal range of motion. Neck supple.  Cardiovascular: Normal rate.   Pulmonary/Chest: Effort normal. No respiratory distress.  Musculoskeletal: He exhibits edema and tenderness.  Flexion of the involved finger with good strength but slightly limited due to swelling. Full extension against resistance.  Neurological: He is alert and oriented to person, place, and time. He exhibits normal muscle tone.  Skin: Skin is warm and dry.  Annular area of erythema, swelling to extensor surface , prox phalanx of 5th digit. Tenderness. Cap refill brisk.  Psychiatric: He has a normal mood and affect.    ED Course  Procedures (including critical care time) Labs Review Labs Reviewed - No data to display  Imaging Review Dg Finger Little Right  10/30/2013   CLINICAL DATA:  Pain with recent infection  EXAM: RIGHT FIFTH FINGER 2+V  COMPARISON:  None.  FINDINGS: Frontal, oblique, and lateral views were obtained. There is soft tissue swelling proximally. No fracture or dislocation. No erosive change or bony destruction. Joint spaces appear intact. No soft tissue air or calcification.  IMPRESSION: Soft tissue swelling proximally. No bony abnormality. No soft tissue abscess or calcification.   Electronically Signed   By: Lowella Grip M.D.   On: 10/30/2013 08:55     MDM   1. Infected finger    Keflex as dir Warm water soaks For worsening, new sx's problems  return.      Janne Napoleon, NP 10/30/13 [phone]

## 2013-10-30 NOTE — ED Provider Notes (Signed)
Medical screening examination/treatment/procedure(s) were performed by resident physician or non-physician practitioner and as supervising physician I was immediately available for consultation/collaboration.   Pauline Good MD.   Billy Fischer, MD 10/30/13 [phone]

## 2013-10-31 ENCOUNTER — Inpatient Hospital Stay (HOSPITAL_COMMUNITY)
Admission: EM | Admit: 2013-10-31 | Discharge: 2013-11-02 | DRG: 603 | Disposition: A | Discharge status: Home / Self Care | Payer: 59 | Attending: Internal Medicine | Admitting: Internal Medicine

## 2013-10-31 ENCOUNTER — Emergency Department (INDEPENDENT_AMBULATORY_CARE_PROVIDER_SITE_OTHER)
Admission: EM | Admit: 2013-10-31 | Discharge: 2013-10-31 | Disposition: A | Discharge status: Nursing Home (Includes ICF) | Payer: 59 | Source: Home / Self Care

## 2013-10-31 ENCOUNTER — Encounter (HOSPITAL_COMMUNITY): Payer: Self-pay | Admitting: Emergency Medicine

## 2013-10-31 DIAGNOSIS — L02519 Cutaneous abscess of unspecified hand: Secondary | ICD-10-CM

## 2013-10-31 DIAGNOSIS — K219 Gastro-esophageal reflux disease without esophagitis: Secondary | ICD-10-CM | POA: Diagnosis present

## 2013-10-31 DIAGNOSIS — I891 Lymphangitis: Secondary | ICD-10-CM

## 2013-10-31 DIAGNOSIS — Z87891 Personal history of nicotine dependence: Secondary | ICD-10-CM

## 2013-10-31 DIAGNOSIS — M5030 Other cervical disc degeneration, unspecified cervical region: Secondary | ICD-10-CM | POA: Diagnosis present

## 2013-10-31 DIAGNOSIS — Z8614 Personal history of Methicillin resistant Staphylococcus aureus infection: Secondary | ICD-10-CM

## 2013-10-31 DIAGNOSIS — L03019 Cellulitis of unspecified finger: Secondary | ICD-10-CM | POA: Diagnosis present

## 2013-10-31 DIAGNOSIS — Z803 Family history of malignant neoplasm of breast: Secondary | ICD-10-CM | POA: Diagnosis not present

## 2013-10-31 DIAGNOSIS — Z823 Family history of stroke: Secondary | ICD-10-CM | POA: Diagnosis not present

## 2013-10-31 DIAGNOSIS — L03011 Cellulitis of right finger: Secondary | ICD-10-CM

## 2013-10-31 DIAGNOSIS — Z8249 Family history of ischemic heart disease and other diseases of the circulatory system: Secondary | ICD-10-CM

## 2013-10-31 DIAGNOSIS — M79609 Pain in unspecified limb: Secondary | ICD-10-CM | POA: Diagnosis not present

## 2013-10-31 DIAGNOSIS — L03119 Cellulitis of unspecified part of limb: Principal | ICD-10-CM

## 2013-10-31 HISTORY — DX: Cellulitis of right finger: L03.011

## 2013-10-31 LAB — CBC WITH DIFFERENTIAL/PLATELET
BASOS PCT: 0 % (ref 0–1)
Basophils Absolute: 0 10*3/uL (ref 0.0–0.1)
EOS ABS: 0.2 10*3/uL (ref 0.0–0.7)
EOS PCT: 1 % (ref 0–5)
HEMATOCRIT: 43 % (ref 39.0–52.0)
HEMOGLOBIN: 15.2 g/dL (ref 13.0–17.0)
LYMPHS ABS: 1.1 10*3/uL (ref 0.7–4.0)
Lymphocytes Relative: 10 % — ABNORMAL LOW (ref 12–46)
MCH: 30.4 pg (ref 26.0–34.0)
MCHC: 35.3 g/dL (ref 30.0–36.0)
MCV: 86 fL (ref 78.0–100.0)
MONO ABS: 0.6 10*3/uL (ref 0.1–1.0)
MONOS PCT: 5 % (ref 3–12)
NEUTROS PCT: 84 % — AB (ref 43–77)
Neutro Abs: 9.5 10*3/uL — ABNORMAL HIGH (ref 1.7–7.7)
Platelets: 213 10*3/uL (ref 150–400)
RBC: 5 MIL/uL (ref 4.22–5.81)
RDW: 12.6 % (ref 11.5–15.5)
WBC: 11.4 10*3/uL — ABNORMAL HIGH (ref 4.0–10.5)

## 2013-10-31 LAB — BASIC METABOLIC PANEL
ANION GAP: 13 (ref 5–15)
BUN: 13 mg/dL (ref 6–23)
CO2: 25 mEq/L (ref 19–32)
CREATININE: 0.86 mg/dL (ref 0.50–1.35)
Calcium: 9.8 mg/dL (ref 8.4–10.5)
Chloride: 99 mEq/L (ref 96–112)
GFR calc non Af Amer: >90 mL/min (ref >90)
GLUCOSE: 120 mg/dL — AB (ref 70–99)
POTASSIUM: 4.1 meq/L (ref 3.7–5.3)
Sodium: 137 mEq/L (ref 137–147)

## 2013-10-31 LAB — SEDIMENTATION RATE: SED RATE: 26 mm/h — AB (ref 0–16)

## 2013-10-31 MED ORDER — VANCOMYCIN HCL IN DEXTROSE 1-5 GM/200ML-% IV SOLN
1000.0000 mg | Freq: Once | INTRAVENOUS | Status: AC
  Administered 2013-10-31: 1000 mg via INTRAVENOUS
  Filled 2013-10-31: qty 200

## 2013-10-31 MED ORDER — SODIUM CHLORIDE 0.9 % IV BOLUS (SEPSIS)
1000.0000 mL | Freq: Once | INTRAVENOUS | Status: AC
  Administered 2013-10-31: 1000 mL via INTRAVENOUS

## 2013-10-31 NOTE — ED Notes (Signed)
Bed controlled made aware that pt needs bed request.

## 2013-10-31 NOTE — ED Notes (Signed)
Right little finger pain, oozing, swollen, redness present.  [facility]/18 at [facility].  Patient reports noting a bump and squeezing bump.  Patient had been fishing this weekend at the beach, no specific memory of any injury

## 2013-10-31 NOTE — ED Notes (Signed)
Rt. Hand: ant. Lateral swelling and tenderness that started from rt. Pinky.  Rt. Pink swelling, redness; "started as a bump, and now is open - draining yellow puss." duration: 3 days. Hx. Of mrsa. Pt. Been fishing off the [place]. Doesn't remember if fishing hook, fin, bate, etc. Stuck him. 10/30/13.  [facility] - prescribed keflex  But seems to not be working.

## 2013-10-31 NOTE — ED Provider Notes (Signed)
CSN: [id number]     Arrival date & time 10/31/13  1849 History   None    Chief Complaint  Patient presents with  . Hand Pain   (Consider location/radiation/quality/duration/timing/severity/associated sxs/prior Treatment) HPI Comments: 58 year old male with history of MRSA cellulitis presents for re-evaluation after having been seen yesterday an infection on his right little finger. Yesterday, he was seen and diagnosed with cellulitis and abscess. The decision was made not to drain this further because it was are draining. The patient was put on Keflex. Today he is here because the redness has gotten much larger, he has red streaks going up his arm, and the finger is much more painful. He denies any systemic symptoms at this time. The wound is still open and draining.  Patient is a 58 y.o. male presenting with hand pain.  Hand Pain    Past Medical History  Diagnosis Date  . Chronic headaches   . GERD (gastroesophageal reflux disease)   . DDD (degenerative disc disease), cervical 2011  . MRSA cellulitis    Past Surgical History  Procedure Laterality Date  . Upper gastrointestinal endoscopy  09/22/2010    54 Fr Maloney dilation for dysphagia, gastritis  . Colonoscopy w/ polypectomy  09/22/2010    2 diminutive adenomas, diverticulosis, ext/int hemorrhoids   Family History  Problem Relation Age of Onset  . Breast cancer Sister   . Heart disease Father   . Stroke Mother   . Heart disease Brother   . Heart attack Brother   . Colon cancer Neg Hx   . Stomach cancer Neg Hx    History  Substance Use Topics  . Smoking status: Former Smoker -- 1.00 packs/day for 30 years    Quit date: 08/11/2010  . Smokeless tobacco: Never Used  . Alcohol Use: No    Review of Systems  Skin: Positive for color change and wound.  All other systems reviewed and are negative.   Allergies  Review of patient's allergies indicates no known allergies.  Home Medications   Prior to Admission  medications   Medication Sig Start Date End Date Taking? Authorizing Provider  cephALEXin (KEFLEX) 500 MG capsule Take 1 capsule (500 mg total) by mouth 4 (four) times daily. 10/30/13   Janne Napoleon, NP  cetirizine (ZYRTEC) 10 MG tablet Take 1 tablet (10 mg total) by mouth daily. 10/23/12   Liam Graham, PA-C  esomeprazole (NEXIUM) 40 MG capsule Take 1 capsule (40 mg total) by mouth daily before breakfast. 03/07/12   Gatha Mayer, MD  tadalafil (CIALIS) 20 MG tablet Take 10 mg by mouth daily as needed.      Historical Provider, MD   BP 125/106  Pulse 73  Temp(Src) 99.1 F (37.3 C) (Oral)  Resp 16  SpO2 94% Physical Exam  Nursing note and vitals reviewed. Constitutional: He is oriented to person, place, and time. He appears well-developed and well-nourished. No distress.  HENT:  Head: Normocephalic.  Pulmonary/Chest: Effort normal. No respiratory distress.  Musculoskeletal:       Right forearm: He exhibits tenderness.       Arms:      Right hand: He exhibits tenderness.       Hands: Neurological: He is alert and oriented to person, place, and time. Coordination normal.  Skin: Skin is warm and dry. No rash noted. He is not diaphoretic.  Psychiatric: He has a normal mood and affect. Judgment normal.    ED Course  Procedures (including critical care time) Labs  Review Labs Reviewed - No data to display  Imaging Review Dg Finger Little Right  10/30/2013   CLINICAL DATA:  Pain with recent infection  EXAM: RIGHT FIFTH FINGER 2+V  COMPARISON:  None.  FINDINGS: Frontal, oblique, and lateral views were obtained. There is soft tissue swelling proximally. No fracture or dislocation. No erosive change or bony destruction. Joint spaces appear intact. No soft tissue air or calcification.  IMPRESSION: Soft tissue swelling proximally. No bony abnormality. No soft tissue abscess or calcification.   Electronically Signed   By: Lowella Grip M.D.   On: 10/30/2013 08:55     MDM   1.  Cellulitis and abscess of hand   2. Lymphangitis    Outpatient treatment failure, most likely due to inappropriate antibiotic choice, most likely MRSA due to history of MRSA and now with cellulitis with a purulent focus. Transferred to the emergency department for IV antibiotics. Will also need incision and drainage most likely.      Liam Graham, PA-C 10/31/13 2038

## 2013-10-31 NOTE — H&P (Signed)
[facility] History and Physical  Lupe Bonner JGG:[id number] DOB: [birth year] DOA: 10/31/2013  Referring physician: Abigail Butts, PA-C PCP: Scarlette Calico, MD   Chief Complaint: Cellulitis  HPI: John Johnston is a 58 y.o. male recent trip to the beach comes home with a right little finger cellulitis. Patient states on returning from the beach on Sunday he was fine however by Monday he was noting some redness in the finger. At this point he went to urgent care and was prescribed kelflex. Patient states that he started it however his finger got worse with drainage and also he noted streaking in his arms. Patient states there was pus coming from the small pustule that had formed. No fevers are noted. He has some pain noted. In addition to this he has a history of MRSA in the past. Patient states that he does not drink and he states he quit smoking about 3 years ago. No history of drug abuse in the past.   Review of Systems:  Complete ROS performed and was unremarkable  Past Medical History  Diagnosis Date  . Chronic headaches   . GERD (gastroesophageal reflux disease)   . DDD (degenerative disc disease), cervical 2011  . MRSA cellulitis    Past Surgical History  Procedure Laterality Date  . Upper gastrointestinal endoscopy  09/22/2010    54 Fr Maloney dilation for dysphagia, gastritis  . Colonoscopy w/ polypectomy  09/22/2010    2 diminutive adenomas, diverticulosis, ext/int hemorrhoids   Social History:  reports that he quit smoking about 3 years ago. He has never used smokeless tobacco. He reports that he does not drink alcohol or use illicit drugs.  No Known Allergies  Family History  Problem Relation Age of Onset  . Breast cancer Sister   . Heart disease Father   . Stroke Mother   . Heart disease Brother   . Heart attack Brother   . Colon cancer Neg Hx   . Stomach cancer Neg Hx      Prior to Admission medications   Medication Sig Start Date End Date Taking?  Authorizing Provider  cephALEXin (KEFLEX) 500 MG capsule Take 500 mg by mouth 4 (four) times daily.   Yes Historical Provider, MD  cetirizine (ZYRTEC) 10 MG tablet Take 10 mg by mouth daily.   Yes Historical Provider, MD  esomeprazole (NEXIUM) 40 MG capsule Take 40 mg by mouth daily at 12 noon.   Yes Historical Provider, MD  tadalafil (CIALIS) 20 MG tablet Take 10 mg by mouth daily as needed for erectile dysfunction.    Yes Historical Provider, MD   Physical Exam: Filed Vitals:   10/31/13 2055 10/31/13 2255  BP: 175/87 168/100  Pulse: 74 66  Temp: 98.1 F (36.7 C) 98 F (36.7 C)  TempSrc: Oral Oral  Resp: 18   Height: 5\' 10"  (1.778 m)   Weight: 74.844 kg (165 lb)   SpO2: 99% 100%    Wt Readings from Last 3 Encounters:  10/31/13 74.844 kg (165 lb)  03/03/12 76.204 kg (168 lb)  02/23/12 76.204 kg (168 lb)    General:  Appears calm and comfortable Eyes: PERRL, normal lids, irises & conjunctiva ENT: grossly normal hearing, lips & tongue Neck: no LAD, masses or thyromegaly Cardiovascular: RRR, no m/r/g. No LE edema. Respiratory: CTA bilaterally, no w/r/r. Normal respiratory effort. Abdomen: soft, ntnd Skin: no rash or induration seen on limited exam Musculoskeletal: area of erythema noted over the right little finger with small pustule noted and erythema tracking  up the arm Psychiatric: grossly normal mood and affect, speech fluent and appropriate Neurologic: grossly non-focal.          Labs on Admission:  Basic Metabolic Panel:  Recent Labs Lab 10/31/13 2141  NA 137  K 4.1  CL 99  CO2 25  GLUCOSE 120*  BUN 13  CREATININE 0.86  CALCIUM 9.8   Liver Function Tests: No results found for this basename: AST, ALT, ALKPHOS, BILITOT, PROT, ALBUMIN,  in the last 168 hours No results found for this basename: LIPASE, AMYLASE,  in the last 168 hours No results found for this basename: AMMONIA,  in the last 168 hours CBC:  Recent Labs Lab 10/31/13 2141  WBC 11.4*    NEUTROABS 9.5*  HGB 15.2  HCT 43.0  MCV 86.0  PLT 213   Cardiac Enzymes: No results found for this basename: CKTOTAL, CKMB, CKMBINDEX, TROPONINI,  in the last 168 hours  BNP (last 3 results) No results found for this basename: PROBNP,  in the last 8760 hours CBG: No results found for this basename: GLUCAP,  in the last 168 hours  Radiological Exams on Admission: Dg Finger Little Right  10/30/2013   CLINICAL DATA:  Pain with recent infection  EXAM: RIGHT FIFTH FINGER 2+V  COMPARISON:  None.  FINDINGS: Frontal, oblique, and lateral views were obtained. There is soft tissue swelling proximally. No fracture or dislocation. No erosive change or bony destruction. Joint spaces appear intact. No soft tissue air or calcification.  IMPRESSION: Soft tissue swelling proximally. No bony abnormality. No soft tissue abscess or calcification.   Electronically Signed   By: Lowella Grip M.D.   On: 10/30/2013 08:55     Assessment/Plan Active Problems:   Cellulitis of fifth finger, right   Cellulitis of right little finger   1. Cellulitis of right fifth digit -he will be admitted for IV antibiotics -had been on oral antibiotics -surgery has seen the patient no surgery indicated at this time  2. GERD -will be continued on his home medications  Code Status: Full COde (must indicate code status--if unknown or must be presumed, indicate so) DVT Prophylaxis:Heparin Family Communication: wife (indicate person spoken with, if applicable, with phone number if by telephone) Disposition Plan: Home (indicate anticipated LOS)  Time spent: 39min  Adisson Deak A [facility] Pager [phone]  **Disclaimer: This note may have been dictated with voice recognition software. Similar sounding words can inadvertently be transcribed and this note may contain transcription errors which may not have been corrected upon publication of note.**

## 2013-10-31 NOTE — ED Provider Notes (Signed)
CSN: [id number]     Arrival date & time 10/31/13  2049 History   First MD Initiated Contact with Patient 10/31/13 2136     Chief Complaint  Patient presents with  . Hand Pain     (Consider location/radiation/quality/duration/timing/severity/associated sxs/prior Treatment) The history is provided by the patient and medical records. No language interpreter was used.    John Johnston is a 58 y.o. male  with a hx of MRSA cellulitis (numerous years) presents to the Emergency Department complaining of gradual, persistent, progressively worsening pain, swelling, erythema to the right little finger onset 3 days ago. Patient was seen in [facility] yesterday and diagnosed with cellulitis. He was put on Keflex. He reports the wound began to drain today and has persistently drained purulent fluid.  He reports increased pain with streaking up the right forearm.  He denies fever, chills, nausea, vomiting.  No diarrhea or alleviating factors. Patient denies known, although reports he was at the beach fishing he questions whether or not he may have been poked by the fishhook.   Past Medical History  Diagnosis Date  . Chronic headaches   . GERD (gastroesophageal reflux disease)   . DDD (degenerative disc disease), cervical 2011  . MRSA cellulitis    Past Surgical History  Procedure Laterality Date  . Upper gastrointestinal endoscopy  09/22/2010    54 Fr Maloney dilation for dysphagia, gastritis  . Colonoscopy w/ polypectomy  09/22/2010    2 diminutive adenomas, diverticulosis, ext/int hemorrhoids   Family History  Problem Relation Age of Onset  . Breast cancer Sister   . Heart disease Father   . Stroke Mother   . Heart disease Brother   . Heart attack Brother   . Colon cancer Neg Hx   . Stomach cancer Neg Hx    History  Substance Use Topics  . Smoking status: Former Smoker -- 1.00 packs/day for 30 years    Quit date: 08/11/2010  . Smokeless tobacco: Never Used  . Alcohol Use: No     Review of Systems  Constitutional: Negative for fever and chills.  Respiratory: Negative for shortness of breath.   Cardiovascular: Negative for chest pain.  Gastrointestinal: Negative for nausea and vomiting.  Endocrine: Negative for polydipsia, polyphagia and polyuria.  Skin: Positive for wound. Negative for rash.  Allergic/Immunologic: Negative for immunocompromised state.  Neurological: Negative for weakness and numbness.  Hematological: Does not bruise/bleed easily.  Psychiatric/Behavioral: The patient is not nervous/anxious.       Allergies  Review of patient's allergies indicates no known allergies.  Home Medications   Prior to Admission medications   Medication Sig Start Date End Date Taking? Authorizing Provider  cephALEXin (KEFLEX) 500 MG capsule Take 500 mg by mouth 4 (four) times daily.   Yes Historical Provider, MD  cetirizine (ZYRTEC) 10 MG tablet Take 10 mg by mouth daily.   Yes Historical Provider, MD  esomeprazole (NEXIUM) 40 MG capsule Take 40 mg by mouth daily at 12 noon.   Yes Historical Provider, MD  tadalafil (CIALIS) 20 MG tablet Take 10 mg by mouth daily as needed for erectile dysfunction.    Yes Historical Provider, MD   BP 175/87  Pulse 74  Temp(Src) 98.1 F (36.7 C) (Oral)  Resp 18  Ht 5\' 10"  (1.778 m)  Wt 165 lb (74.844 kg)  BMI 23.68 kg/m2  SpO2 99% Physical Exam  Nursing note and vitals reviewed. Constitutional: He is oriented to person, place, and time. He appears well-developed  and well-nourished. No distress.  Awake, alert, nontoxic appearance  HENT:  Head: Normocephalic and atraumatic.  Mouth/Throat: Oropharynx is clear and moist. No oropharyngeal exudate.  Eyes: Conjunctivae are normal. No scleral icterus.  Neck: Normal range of motion. Neck supple.  Cardiovascular: Normal rate, regular rhythm, normal heart sounds and intact distal pulses.   No murmur heard. Capillary refill less than 3 seconds  Pulmonary/Chest: Effort normal  and breath sounds normal. No respiratory distress. He has no wheezes.  Equal chest expansion  Abdominal: Soft. Bowel sounds are normal. He exhibits no distension and no mass. There is no tenderness. There is no rebound and no guarding.  Musculoskeletal: Normal range of motion. He exhibits no edema.       Right hand: He exhibits swelling. Normal sensation noted.       Hands: Decreased range of motion of the right little finger at the MCP and PIP joints but full range of motion of the DIP joint and all other joints of the right hand  Significant swelling, erythema and induration encompassing the right little finger, wound is open and draining; swelling and erythema to the medial portion of the dorsum of the right hand with streaking up the right forearm  Lymphadenopathy:    He has no cervical adenopathy.  Neurological: He is alert and oriented to person, place, and time.  Speech is clear and goal oriented Moves extremities without ataxia Sensation intact to doll and sharp Strength 5 out of 5 in the right hand  Skin: Skin is warm and dry. He is not diaphoretic. There is erythema.  Psychiatric: He has a normal mood and affect.    ED Course  Procedures (including critical care time) Labs Review Labs Reviewed  CBC WITH DIFFERENTIAL - Abnormal; Notable for the following:    WBC 11.4 (*)    Neutrophils Relative % 84 (*)    Neutro Abs 9.5 (*)    Lymphocytes Relative 10 (*)    All other components within normal limits  BASIC METABOLIC PANEL - Abnormal; Notable for the following:    Glucose, Bld 120 (*)    All other components within normal limits  WOUND CULTURE  WOUND CULTURE  SEDIMENTATION RATE    Imaging Review Dg Finger Little Right  10/30/2013   CLINICAL DATA:  Pain with recent infection  EXAM: RIGHT FIFTH FINGER 2+V  COMPARISON:  None.  FINDINGS: Frontal, oblique, and lateral views were obtained. There is soft tissue swelling proximally. No fracture or dislocation. No erosive  change or bony destruction. Joint spaces appear intact. No soft tissue air or calcification.  IMPRESSION: Soft tissue swelling proximally. No bony abnormality. No soft tissue abscess or calcification.   Electronically Signed   By: Lowella Grip M.D.   On: 10/30/2013 08:55     EKG Interpretation None      MDM   Final diagnoses:  Cellulitis of fifth finger of right hand   Jadiel Schmieder presents with worsening infection of the right hand.  Patient has failed outpatient treatment with Keflex, but has HX of MRSA infection in the knee. Wound culture taken.  Patient without systemic signs and symptoms of bacteremia however concern for progressing cellulitis and streaking. At this time I believe the patient will need admission with IV antibiotics.  IV vancomycin begun after wound culture obtained.  Labs pending. Will consult with hand and plan for medical admission.  10:38 PM Discussed with Dr. Burney Gauze who recommends that pt follow-up in his office for outpatient management if  he continues to improve with IV abx.  If he worsens and requires OR management the on call hand surgeon should be contacted.  Mild leukocytosis; no other lab abnormalities.  Wound culture and sed rate pending.    PCP: Lillia Mountain  The patient was discussed with and seen by Dr. Vivi Martens who agrees that patient does not need OR I&D, will begin abx and admit.    10:54 PM Discussed with Dr. Humphrey Rolls who will admit to Med-Surg.    BP 175/87  Pulse 74  Temp(Src) 98.1 F (36.7 C) (Oral)  Resp 18  Ht 5\' 10"  (1.778 m)  Wt 165 lb (74.844 kg)  BMI 23.68 kg/m2  SpO2 99%   Abigail Butts, PA-C 10/31/13 2256

## 2013-10-31 NOTE — ED Notes (Signed)
Wound culture per H.M. PA

## 2013-10-31 NOTE — ED Provider Notes (Signed)
Medical screening examination/treatment/procedure(s) were performed by non-physician practitioner and as supervising physician I was immediately available for consultation/collaboration.  Philipp Deputy, M.D.  Harden Mo, MD 10/31/13 [phone]

## 2013-11-01 LAB — COMPREHENSIVE METABOLIC PANEL
ALBUMIN: 3.4 g/dL — AB (ref 3.5–5.2)
ALK PHOS: 62 U/L (ref 39–117)
ALT: 28 U/L (ref 0–53)
ANION GAP: 12 (ref 5–15)
AST: 22 U/L (ref 0–37)
BUN: 12 mg/dL (ref 6–23)
CHLORIDE: 103 meq/L (ref 96–112)
CO2: 27 mEq/L (ref 19–32)
CREATININE: 0.86 mg/dL (ref 0.50–1.35)
Calcium: 9.2 mg/dL (ref 8.4–10.5)
GFR calc Af Amer: >90 mL/min (ref >90)
GFR calc non Af Amer: >90 mL/min (ref >90)
Glucose, Bld: 102 mg/dL — ABNORMAL HIGH (ref 70–99)
POTASSIUM: 4 meq/L (ref 3.7–5.3)
Sodium: 142 mEq/L (ref 137–147)
Total Bilirubin: 0.4 mg/dL (ref 0.3–1.2)
Total Protein: 6.9 g/dL (ref 6.0–8.3)

## 2013-11-01 LAB — TSH: TSH: 2.09 u[IU]/mL (ref 0.350–4.500)

## 2013-11-01 LAB — CBC
HEMATOCRIT: 42.2 % (ref 39.0–52.0)
Hemoglobin: 14.3 g/dL (ref 13.0–17.0)
MCH: 29.4 pg (ref 26.0–34.0)
MCHC: 33.9 g/dL (ref 30.0–36.0)
MCV: 86.8 fL (ref 78.0–100.0)
Platelets: 210 10*3/uL (ref 150–400)
RBC: 4.86 MIL/uL (ref 4.22–5.81)
RDW: 12.5 % (ref 11.5–15.5)
WBC: 8.6 10*3/uL (ref 4.0–10.5)

## 2013-11-01 LAB — GLUCOSE, CAPILLARY: Glucose-Capillary: 104 mg/dL — ABNORMAL HIGH (ref 70–99)

## 2013-11-01 LAB — HEMOGLOBIN A1C
Hgb A1c MFr Bld: 5.9 % — ABNORMAL HIGH (ref <5.7)
MEAN PLASMA GLUCOSE: 123 mg/dL — AB (ref <117)

## 2013-11-01 MED ORDER — ACETAMINOPHEN 650 MG RE SUPP: 650.0000 mg | Freq: Four times a day (QID) | RECTAL | Status: DC | PRN

## 2013-11-01 MED ORDER — VANCOMYCIN HCL IN DEXTROSE 1-5 GM/200ML-% IV SOLN
1000.0000 mg | Freq: Two times a day (BID) | INTRAVENOUS | Status: DC
Start: 2013-11-01 — End: 2013-11-02
  Administered 2013-11-01 – 2013-11-02 (×3): 1000 mg via INTRAVENOUS
  Filled 2013-11-01 (×4): qty 200

## 2013-11-01 MED ORDER — PANTOPRAZOLE SODIUM 40 MG PO TBEC
80.0000 mg | DELAYED_RELEASE_TABLET | Freq: Every day | ORAL | Status: DC
  Administered 2013-11-01 – 2013-11-02 (×2): 80 mg via ORAL
  Filled 2013-11-01 (×2): qty 2

## 2013-11-01 MED ORDER — POLYETHYLENE GLYCOL 3350 17 G PO PACK: 17.0000 g | PACK | Freq: Every day | ORAL | Status: DC | PRN

## 2013-11-01 MED ORDER — HEPARIN SODIUM (PORCINE) 5000 UNIT/ML IJ SOLN
5000.0000 [IU] | Freq: Three times a day (TID) | INTRAMUSCULAR | Status: DC
  Administered 2013-11-01 – 2013-11-02 (×4): 5000 [IU] via SUBCUTANEOUS
  Filled 2013-11-01 (×4): qty 1

## 2013-11-01 MED ORDER — ONDANSETRON HCL 4 MG PO TABS: 4.0000 mg | ORAL_TABLET | Freq: Four times a day (QID) | ORAL | Status: DC | PRN

## 2013-11-01 MED ORDER — LORATADINE 10 MG PO TABS
10.0000 mg | ORAL_TABLET | Freq: Every day | ORAL | Status: DC
  Administered 2013-11-01 – 2013-11-02 (×2): 10 mg via ORAL
  Filled 2013-11-01 (×2): qty 1

## 2013-11-01 MED ORDER — ONDANSETRON HCL 4 MG/2ML IJ SOLN: 4.0000 mg | Freq: Four times a day (QID) | INTRAMUSCULAR | Status: DC | PRN

## 2013-11-01 MED ORDER — VITAMIN B-1 100 MG PO TABS
100.0000 mg | ORAL_TABLET | Freq: Every day | ORAL | Status: DC
  Administered 2013-11-01 – 2013-11-02 (×2): 100 mg via ORAL
  Filled 2013-11-01 (×2): qty 1

## 2013-11-01 MED ORDER — OXYCODONE HCL 5 MG PO TABS
5.0000 mg | ORAL_TABLET | ORAL | Status: DC | PRN
  Administered 2013-11-02: 5 mg via ORAL
  Filled 2013-11-01: qty 1

## 2013-11-01 MED ORDER — DOCUSATE SODIUM 100 MG PO CAPS
100.0000 mg | ORAL_CAPSULE | Freq: Two times a day (BID) | ORAL | Status: DC
  Administered 2013-11-01 – 2013-11-02 (×3): 100 mg via ORAL
  Filled 2013-11-01 (×3): qty 1

## 2013-11-01 MED ORDER — ADULT MULTIVITAMIN W/MINERALS CH
1.0000 | ORAL_TABLET | Freq: Every day | ORAL | Status: DC
  Administered 2013-11-01 – 2013-11-02 (×2): 1 via ORAL
  Filled 2013-11-01 (×2): qty 1

## 2013-11-01 MED ORDER — FOLIC ACID 1 MG PO TABS
1.0000 mg | ORAL_TABLET | Freq: Every day | ORAL | Status: DC
  Administered 2013-11-01 – 2013-11-02 (×2): 1 mg via ORAL
  Filled 2013-11-01 (×2): qty 1

## 2013-11-01 MED ORDER — SODIUM CHLORIDE 0.9 % IJ SOLN
3.0000 mL | Freq: Two times a day (BID) | INTRAMUSCULAR | Status: DC
  Administered 2013-11-01: 3 mL via INTRAVENOUS

## 2013-11-01 MED ORDER — ACETAMINOPHEN 325 MG PO TABS: 650.0000 mg | ORAL_TABLET | Freq: Four times a day (QID) | ORAL | Status: DC | PRN

## 2013-11-01 MED ORDER — SORBITOL 70 % SOLN: 30.0000 mL | Freq: Every day | Status: DC | PRN

## 2013-11-01 MED ORDER — SODIUM CHLORIDE 0.9 % IV SOLN: 250.0000 mL | INTRAVENOUS | Status: DC | PRN

## 2013-11-01 MED ORDER — SODIUM CHLORIDE 0.9 % IJ SOLN
3.0000 mL | INTRAMUSCULAR | Status: DC | PRN
Start: 2013-11-01 — End: 2013-11-02

## 2013-11-01 NOTE — Progress Notes (Addendum)
[facility] PROGRESS NOTE  John Johnston DZH:[id number] DOB: [birth year] DOA: 10/31/2013 PCP: Scarlette Calico, MD  Assessment/Plan:  1. Cellulitis of right fifth digit -he will be admitted for IV antibiotics  -had been on oral antibiotics  -surgery has seen the patient no surgery indicated at this time  2. GERD  -will be continued on his home medications  Code Status: full code Family Communication: family at bedside Disposition Plan: pending.    Consultants:  Dr Burney Gauze consulted by EDP.   Procedures:    Antibiotics:  IV vancomycin.   HPI/Subjective: Pain is controlled.   Objective: Filed Vitals:   11/01/13 0925  BP: 139/78  Pulse: 65  Temp: 98.5 F (36.9 C)  Resp: 18    Intake/Output Summary (Last 24 hours) at 11/01/13 1449 Last data filed at 11/01/13 0900  Gross per 24 hour  Intake   1740 ml  Output      0 ml  Net   1740 ml   Filed Weights   10/31/13 2055 11/01/13 0100  Weight: 74.844 kg (165 lb) 74.844 kg (165 lb)    Exam:   General:  Alert afebrile comfortable  Cardiovascular: s1s2 RRR,   Respiratory: ctab  Abdomen: soft NT ND BS+  Musculoskeletal: right finger cellulitis.   Data Reviewed: Basic Metabolic Panel:  Recent Labs Lab 10/31/13 2141 11/01/13 0600  NA 137 142  K 4.1 4.0  CL 99 103  CO2 25 27  GLUCOSE 120* 102*  BUN 13 12  CREATININE 0.86 0.86  CALCIUM 9.8 9.2   Liver Function Tests:  Recent Labs Lab 11/01/13 0600  AST 22  ALT 28  ALKPHOS 62  BILITOT 0.4  PROT 6.9  ALBUMIN 3.4*   No results found for this basename: LIPASE, AMYLASE,  in the last 168 hours No results found for this basename: AMMONIA,  in the last 168 hours CBC:  Recent Labs Lab 10/31/13 2141 11/01/13 0600  WBC 11.4* 8.6  NEUTROABS 9.5*  --   HGB 15.2 14.3  HCT 43.0 42.2  MCV 86.0 86.8  PLT 213 210   Cardiac Enzymes: No results found for this basename: CKTOTAL, CKMB, CKMBINDEX, TROPONINI,  in the last 168 hours BNP (last 3  results) No results found for this basename: PROBNP,  in the last 8760 hours CBG:  Recent Labs Lab 11/01/13 0811  GLUCAP 104*    Recent Results (from the past 240 hour(s))  WOUND CULTURE     Status: None   Collection Time    10/31/13  9:28 PM      Result Value Ref Range Status   Specimen Description WOUND LEFT HAND   Final   Special Requests NONE   Final   Gram Stain     Final   Value: FEW WBC PRESENT,BOTH PMN AND MONONUCLEAR     NO SQUAMOUS EPITHELIAL CELLS SEEN     NO ORGANISMS SEEN     Performed at [facility]   Culture     Final   Value: NO GROWTH     Performed at [facility]   Report Status PENDING   Incomplete     Studies: No results found.  Scheduled Meds: . docusate sodium  100 mg Oral BID  . folic acid  1 mg Oral Daily  . heparin  5,000 Units Subcutaneous 3 times per day  . loratadine  10 mg Oral Daily  . multivitamin with minerals  1 tablet Oral Daily  . pantoprazole  80 mg Oral  Q1200  . sodium chloride  3 mL Intravenous Q12H  . thiamine  100 mg Oral Daily  . vancomycin  1,000 mg Intravenous Q12H   Continuous Infusions:   Active Problems:   Cellulitis of fifth finger, right   Cellulitis of right little finger    Time spent: 25 minutes.     [facility] Pager [phone] If 7PM-7AM, please contact night-coverage at [email or web address], password [facility] 11/01/2013, 2:49 PM  LOS: 1 day

## 2013-11-01 NOTE — Progress Notes (Signed)
Utilization review completed.  

## 2013-11-01 NOTE — Progress Notes (Signed)
Patient arrived to floor from ED via wheelchair. Alert and Oriented x4. Calm and cooperative. Pt settled in room with wife at bedside.

## 2013-11-01 NOTE — ED Notes (Signed)
Report attempted 

## 2013-11-01 NOTE — ED Provider Notes (Signed)
Medical screening examination/treatment/procedure(s) were performed by non-physician practitioner and as supervising physician I was immediately available for consultation/collaboration.   Leota Jacobsen, MD 11/01/13 2259

## 2013-11-01 NOTE — Progress Notes (Signed)
ANTIBIOTIC CONSULT NOTE - INITIAL  Pharmacy Consult for vancomycin  Indication: cellulitis  No Known Allergies  Patient Measurements: Height: 5\' 10"  (177.8 cm) Weight: 165 lb (74.844 kg) IBW/kg (Calculated) : 73 Adjusted Body Weight:   Vital Signs: Temp: 98.1 F (36.7 C) (08/20 0044) Temp src: Oral (08/19 2335) BP: 152/76 mmHg (08/20 0044) Pulse Rate: 72 (08/20 0044) Intake/Output from previous day: 08/19 0701 - 08/20 0700 In: 1500 [I.V.:1000] Out: -  Intake/Output from this shift: Total I/O In: 1500 [I.V.:1000; Other:500] Out: -   Labs:  Recent Labs  10/31/13 2141  WBC 11.4*  HGB 15.2  PLT 213  CREATININE 0.86   Estimated Creatinine Clearance: 99 ml/min (by C-G formula based on Cr of 0.86). No results found for this basename: VANCOTROUGH, VANCOPEAK, VANCORANDOM, GENTTROUGH, GENTPEAK, GENTRANDOM, TOBRATROUGH, TOBRAPEAK, TOBRARND, AMIKACINPEAK, AMIKACINTROU, AMIKACIN,  in the last 72 hours   Microbiology: No results found for this or any previous visit (from the past 720 hour(s)).  Medical History: Past Medical History  Diagnosis Date  . Chronic headaches   . GERD (gastroesophageal reflux disease)   . DDD (degenerative disc disease), cervical 2011  . MRSA cellulitis     Assessment: 58 y.o. male recent trip to the beach comes home with a right little finger cellulitis. Was rx'ed keflex but has worsened since. Hx of MRSA cellulitis. No plans for surgery. No fevers noted, wbc slightly elevated at 11.   Goal of Therapy:  Vancomycin trough level 10-15 mcg/ml  Plan:  Measure antibiotic drug levels at steady state Follow up culture results Vancomycin 1g IV q12 hours  Erin Hearing PharmD., BCPS Clinical Pharmacist Pager [phone] 11/01/2013 1:24 AM

## 2013-11-02 MED ORDER — ADULT MULTIVITAMIN W/MINERALS CH: 1.0000 | ORAL_TABLET | Freq: Every day | ORAL | Status: DC

## 2013-11-02 MED ORDER — CLINDAMYCIN HCL 300 MG PO CAPS: 300.0000 mg | ORAL_CAPSULE | Freq: Three times a day (TID) | ORAL | Status: DC

## 2013-11-02 NOTE — Discharge Instructions (Signed)

## 2013-11-02 NOTE — Consult Note (Signed)
Reason for Consult:infection Referring Physician: Internal Medicine  CC:My finger got infected  HPI:  John Johnston is an 58 y.o. right handed male who presents with swelling, pain, erythema of RSF up arm. Pt states he may have injured figer while fishing this past wknd, finger became red, swollen, small area dorsally that he unroofed and finger became worse.      Pain is rated at   6 /10 and is described as sharp/dull.  Pain is constant.  Pain is made better by rest/immobilization, worse with motion.   Associated signs/symptoms: erythema up arm Previous treatment:  Vancomycin  Past Medical History  Diagnosis Date  . Chronic headaches   . GERD (gastroesophageal reflux disease)   . DDD (degenerative disc disease), cervical 2011  . MRSA cellulitis     Past Surgical History  Procedure Laterality Date  . Upper gastrointestinal endoscopy  09/22/2010    54 Fr Maloney dilation for dysphagia, gastritis  . Colonoscopy w/ polypectomy  09/22/2010    2 diminutive adenomas, diverticulosis, ext/int hemorrhoids    Family History  Problem Relation Age of Onset  . Breast cancer Sister   . Heart disease Father   . Stroke Mother   . Heart disease Brother   . Heart attack Brother   . Colon cancer Neg Hx   . Stomach cancer Neg Hx     Social History:  reports that he quit smoking about 3 years ago. He has never used smokeless tobacco. He reports that he does not drink alcohol or use illicit drugs.  Allergies: No Known Allergies  Medications: I have reviewed the patient's current medications.  Results for orders placed during the hospital encounter of 10/31/13 (from the past 48 hour(s))  WOUND CULTURE     Status: None   Collection Time    10/31/13  9:28 PM      Result Value Ref Range   Specimen Description WOUND LEFT HAND     Special Requests NONE     Gram Stain       Value: FEW WBC PRESENT,BOTH PMN AND MONONUCLEAR     NO SQUAMOUS EPITHELIAL CELLS SEEN     NO ORGANISMS SEEN     Performed at  [facility]   Culture       Value: FEW STAPHYLOCOCCUS AUREUS     Note: RIFAMPIN AND GENTAMICIN SHOULD NOT BE USED AS SINGLE DRUGS FOR TREATMENT OF STAPH INFECTIONS.     Performed at [facility]   Report Status PENDING    CBC WITH DIFFERENTIAL     Status: Abnormal   Collection Time    10/31/13  9:41 PM      Result Value Ref Range   WBC 11.4 (*) 4.0 - 10.5 K/uL   RBC 5.00  4.22 - 5.81 MIL/uL   Hemoglobin 15.2  13.0 - 17.0 g/dL   HCT 43.0  39.0 - 52.0 %   MCV 86.0  78.0 - 100.0 fL   MCH 30.4  26.0 - 34.0 pg   MCHC 35.3  30.0 - 36.0 g/dL   RDW 12.6  11.5 - 15.5 %   Platelets 213  150 - 400 K/uL   Neutrophils Relative % 84 (*) 43 - 77 %   Neutro Abs 9.5 (*) 1.7 - 7.7 K/uL   Lymphocytes Relative 10 (*) 12 - 46 %   Lymphs Abs 1.1  0.7 - 4.0 K/uL   Monocytes Relative 5  3 - 12 %   Monocytes Absolute 0.6  0.1 - 1.0 K/uL   Eosinophils Relative 1  0 - 5 %   Eosinophils Absolute 0.2  0.0 - 0.7 K/uL   Basophils Relative 0  0 - 1 %   Basophils Absolute 0.0  0.0 - 0.1 K/uL  BASIC METABOLIC PANEL     Status: Abnormal   Collection Time    10/31/13  9:41 PM      Result Value Ref Range   Sodium 137  137 - 147 mEq/L   Potassium 4.1  3.7 - 5.3 mEq/L   Chloride 99  96 - 112 mEq/L   CO2 25  19 - 32 mEq/L   Glucose, Bld 120 (*) 70 - 99 mg/dL   BUN 13  6 - 23 mg/dL   Creatinine, Ser 0.86  0.50 - 1.35 mg/dL   Calcium 9.8  8.4 - 10.5 mg/dL   GFR calc non Af Amer >90  >90 mL/min   GFR calc Af Amer >90  >90 mL/min   Comment: (NOTE)     The eGFR has been calculated using the CKD EPI equation.     This calculation has not been validated in all clinical situations.     eGFR's persistently <90 mL/min signify possible Chronic Kidney     Disease.   Anion gap 13  5 - 15  SEDIMENTATION RATE     Status: Abnormal   Collection Time    10/31/13  9:41 PM      Result Value Ref Range   Sed Rate 26 (*) 0 - 16 mm/hr  COMPREHENSIVE METABOLIC PANEL     Status: Abnormal   Collection Time     11/01/13  6:00 AM      Result Value Ref Range   Sodium 142  137 - 147 mEq/L   Potassium 4.0  3.7 - 5.3 mEq/L   Chloride 103  96 - 112 mEq/L   CO2 27  19 - 32 mEq/L   Glucose, Bld 102 (*) 70 - 99 mg/dL   BUN 12  6 - 23 mg/dL   Creatinine, Ser 0.86  0.50 - 1.35 mg/dL   Calcium 9.2  8.4 - 10.5 mg/dL   Total Protein 6.9  6.0 - 8.3 g/dL   Albumin 3.4 (*) 3.5 - 5.2 g/dL   AST 22  0 - 37 U/L   ALT 28  0 - 53 U/L   Alkaline Phosphatase 62  39 - 117 U/L   Total Bilirubin 0.4  0.3 - 1.2 mg/dL   GFR calc non Af Amer >90  >90 mL/min   GFR calc Af Amer >90  >90 mL/min   Comment: (NOTE)     The eGFR has been calculated using the CKD EPI equation.     This calculation has not been validated in all clinical situations.     eGFR's persistently <90 mL/min signify possible Chronic Kidney     Disease.   Anion gap 12  5 - 15  CBC     Status: None   Collection Time    11/01/13  6:00 AM      Result Value Ref Range   WBC 8.6  4.0 - 10.5 K/uL   RBC 4.86  4.22 - 5.81 MIL/uL   Hemoglobin 14.3  13.0 - 17.0 g/dL   HCT 42.2  39.0 - 52.0 %   MCV 86.8  78.0 - 100.0 fL   MCH 29.4  26.0 - 34.0 pg   MCHC 33.9  30.0 - 36.0 g/dL  RDW 12.5  11.5 - 15.5 %   Platelets 210  150 - 400 K/uL  HEMOGLOBIN A1C     Status: Abnormal   Collection Time    11/01/13  6:00 AM      Result Value Ref Range   Hemoglobin A1C 5.9 (*) <5.7 %   Comment: (NOTE)                                                                               According to the ADA Clinical Practice Recommendations for 2011, when     HbA1c is used as a screening test:      >=6.5%   Diagnostic of Diabetes Mellitus               (if abnormal result is confirmed)     5.7-6.4%   Increased risk of developing Diabetes Mellitus     References:Diagnosis and Classification of Diabetes Mellitus,Diabetes     JZPH,1505,69(VXYIA 1):S62-S69 and Standards of Medical Care in             Diabetes - 2011,Diabetes Care,2011,34 (Suppl 1):S11-S61.   Mean Plasma Glucose  123 (*) <117 mg/dL   Comment: Performed at [facility]  TSH     Status: None   Collection Time    11/01/13  6:00 AM      Result Value Ref Range   TSH 2.090  0.350 - 4.500 uIU/mL  GLUCOSE, CAPILLARY     Status: Abnormal   Collection Time    11/01/13  8:11 AM      Result Value Ref Range   Glucose-Capillary 104 (*) 70 - 99 mg/dL    No results found.  Pertinent items are noted in HPI. Temp:  [96.4 F (35.8 C)-98.7 F (37.1 C)] 97.9 F (36.6 C) (08/21 0955) Pulse Rate:  [55-81] 73 (08/21 0955) Resp:  [18-20] 20 (08/21 0955) BP: (118-145)/(66-95) 122/66 mmHg (08/21 0955) SpO2:  [97 %-100 %] 100 % (08/21 0955) General appearance: alert and cooperative Resp: clear to auscultation bilaterally Cardio: regular rate and rhythm GI: soft, non-tender; bowel sounds normal; no masses,  no organomegaly Extremities: extremities normal, atraumatic, no cyanosis or edema and except for RSF dorsal fluid (pus) collection witherythema to dorsal hand, no flexor tenderness   Assessment: Abscess, cellulitis RSF  Plan: Finger I&D'd, purulent material superficial skin and subcutaneously, - drained, wound irrigated, and gentle packed with tip of gauze.  Post care instructions given to patient. Would give course of oral antibiotics , f/u as necessary. I have discussed this treatment plan in detail with patient and/family, including the risks of the recommended treatment or surgery, the benefits and the alternatives.  The patient and/ caregiver understands that additional treatment may be necessary.  Walter Min CHRISTOPHER 11/02/2013, 12:24 PM

## 2013-11-02 NOTE — Progress Notes (Signed)
Patient alert and oriented, MAEx4, voiding adequate amount of urine with no c/o pain. Patient discharged home with spouse. Patient and spouse stated understanding of instructions given.

## 2013-11-03 LAB — WOUND CULTURE

## 2013-11-12 NOTE — Discharge Summary (Signed)
Physician Discharge Summary  John Johnston WEX:[id number] DOB: 06-28-1955 DOA: 10/31/2013  PCP: John Calico, MD  Admit date: 10/31/2013 Discharge date: 11/02/2013  Time spent: 30 minutes  Recommendations for Outpatient Follow-up:  1. Follow u pwith PCP in one week.  Discharge Diagnoses:  Active Problems:   Cellulitis of fifth finger, right   Cellulitis of right little finger   Discharge Condition: improved.   Diet recommendation: regular.   Filed Weights   10/31/13 2055 11/01/13 0100  Weight: 74.844 kg (165 lb) 74.844 kg (165 lb)    History of present illness:  John Johnston is a 58 y.o. male recent trip to the beach comes home with a right little finger cellulitis   Hospital Course:   1. Cellulitis of right fifth digit -he was admitted for IV antibiotics , surgery consulted and I&D done. Discharged on oral antibiotics.  2. GERD  -will be continued on his home medications  Procedures:  I&D  Consultations:  Hand surgery.   Discharge Exam: Filed Vitals:   11/02/13 0955  BP: 122/66  Pulse: 73  Temp: 97.9 F (36.6 C)  Resp: 20    General: alert afebrile comfortable Cardiovascular: s1s2 Respiratory: ctab  Discharge Instructions You were cared for by a hospitalist during your hospital stay. If you have any questions about your discharge medications or the care you received while you were in the hospital after you are discharged, you can call the unit and asked to speak with the hospitalist on call if the hospitalist that took care of you is not available. Once you are discharged, your primary care physician will handle any further medical issues. Please note that NO REFILLS for any discharge medications will be authorized once you are discharged, as it is imperative that you return to your primary care physician (or establish a relationship with a primary care physician if you do not have one) for your aftercare needs so that they can reassess your need for medications  and monitor your lab values.  Discharge Instructions   Discharge instructions    Complete by:  As directed   Follow up with DR John Johnston as recommended.  Follow up with PCP IN 2 weeks.            Medication List    STOP taking these medications       cephALEXin 500 MG capsule  Commonly known as:  KEFLEX      TAKE these medications       cetirizine 10 MG tablet  Commonly known as:  ZYRTEC  Take 10 mg by mouth daily.     clindamycin 300 MG capsule  Commonly known as:  CLEOCIN  Take 1 capsule (300 mg total) by mouth 3 (three) times daily.     esomeprazole 40 MG capsule  Commonly known as:  NEXIUM  Take 40 mg by mouth daily at 12 noon.     multivitamin with minerals Tabs tablet  Take 1 tablet by mouth daily.     tadalafil 20 MG tablet  Commonly known as:  CIALIS  Take 10 mg by mouth daily as needed for erectile dysfunction.       No Known Allergies     Follow-up Information   Follow up with John Calico, MD In 2 weeks.   Specialty:  Internal Medicine   Contact information:   [address] [address] [phone]       Follow up with John Char, MD. (As needed)  Specialty:  General Surgery   Contact information:   [address] [place] [address] [phone]        The results of significant diagnostics from this hospitalization (including imaging, microbiology, ancillary and laboratory) are listed below for reference.    Significant Diagnostic Studies: Dg Finger Little Right  10-Nov-2013   CLINICAL DATA:  Pain with recent infection  EXAM: RIGHT FIFTH FINGER 2+V  COMPARISON:  None.  FINDINGS: Frontal, oblique, and lateral views were obtained. There is soft tissue swelling proximally. No fracture or dislocation. No erosive change or bony destruction. Joint spaces appear intact. No soft tissue air or calcification.  IMPRESSION: Soft tissue swelling proximally. No bony abnormality. No soft tissue  abscess or calcification.   Electronically Signed   By: John Johnston M.D.   On: 11/10/2013 08:55    Microbiology: No results found for this or any previous visit (from the past 240 hour(s)).   Labs: Basic Metabolic Panel: No results found for this basename: NA, K, CL, CO2, GLUCOSE, BUN, CREATININE, CALCIUM, MG, PHOS,  in the last 168 hours Liver Function Tests: No results found for this basename: AST, ALT, ALKPHOS, BILITOT, PROT, ALBUMIN,  in the last 168 hours No results found for this basename: LIPASE, AMYLASE,  in the last 168 hours No results found for this basename: AMMONIA,  in the last 168 hours CBC: No results found for this basename: WBC, NEUTROABS, HGB, HCT, MCV, PLT,  in the last 168 hours Cardiac Enzymes: No results found for this basename: CKTOTAL, CKMB, CKMBINDEX, TROPONINI,  in the last 168 hours BNP: BNP (last 3 results) No results found for this basename: PROBNP,  in the last 8760 hours CBG: No results found for this basename: GLUCAP,  in the last 168 hours     Signed:  Princella Johnston  [facility] 11/02/2013, 1:02 PM

## 2014-02-05 ENCOUNTER — Telehealth: Payer: Self-pay | Admitting: Internal Medicine

## 2014-02-05 ENCOUNTER — Other Ambulatory Visit: Payer: Self-pay | Admitting: Internal Medicine

## 2014-02-05 MED ORDER — ESOMEPRAZOLE MAGNESIUM 40 MG PO CPDR: 40.0000 mg | DELAYED_RELEASE_CAPSULE | Freq: Every day | ORAL | Status: DC

## 2014-02-05 NOTE — Telephone Encounter (Signed)
Patient made appointment and Nexium sent in as requested.

## 2014-02-06 ENCOUNTER — Telehealth: Payer: Self-pay | Admitting: Physician Assistant

## 2014-02-06 MED ORDER — ESOMEPRAZOLE MAGNESIUM 40 MG PO CPDR: 40.0000 mg | DELAYED_RELEASE_CAPSULE | Freq: Two times a day (BID) | ORAL | Status: DC

## 2014-02-06 NOTE — Telephone Encounter (Signed)
Rec call from pts wife, Pam. She says pt was referred to Dr Erik Obey last year, and Dr Erik Obey increased pts nexium to bid. Pt needed refill and called here, and qd script was sent. Pam says generic does not work for pt. Script for bid nexium, DAW, sent to [facility]. Pt has appt to f/u next week.

## 2014-02-12 ENCOUNTER — Encounter: Payer: Self-pay | Admitting: Physician Assistant

## 2014-02-12 ENCOUNTER — Ambulatory Visit (INDEPENDENT_AMBULATORY_CARE_PROVIDER_SITE_OTHER): Payer: 59 | Admitting: Physician Assistant

## 2014-02-12 VITALS — BP 142/78 | HR 64 | Ht 69.5 in | Wt 169.0 lb

## 2014-02-12 DIAGNOSIS — R1310 Dysphagia, unspecified: Secondary | ICD-10-CM

## 2014-02-12 DIAGNOSIS — K219 Gastro-esophageal reflux disease without esophagitis: Secondary | ICD-10-CM

## 2014-02-12 DIAGNOSIS — R1013 Epigastric pain: Secondary | ICD-10-CM

## 2014-02-12 MED ORDER — METOCLOPRAMIDE HCL 10 MG PO TABS: 10.0000 mg | ORAL_TABLET | Freq: Every day | ORAL | Status: DC

## 2014-02-12 NOTE — Patient Instructions (Signed)
You have been scheduled for an endoscopy. Please follow written instructions given to you at your visit today. If you use inhalers (even only as needed), please bring them with you on the day of your procedure. Your physician has requested that you go to www.startemmi.com and enter the access code given to you at your visit today. This web site gives a general overview about your procedure. However, you should still follow specific instructions given to you by our office regarding your preparation for the procedure.   We have sent the following medications to your pharmacy for you to pick up at your convenience:  Reglan  Continue to take your Nexium twice a day.  You have been given information on a gastroparesis diet

## 2014-02-12 NOTE — Progress Notes (Signed)
Patient ID: John Johnston, male   DOB: Jan 13, 1956, 58 y.o.   MRN: [id number]     History of Present Illness: This is a follow-up for this 58 year old male who is been followed by Dr. Carlean Purl for GERD in the past. The patient last had an EGD with biopsy and Maloney dilation of the esophagus on 03/03/2012. After that visit, he had relief of his dysphagia and has had no problems swallowing until 3 or 4 months ago when he again began to have dysphagia to dry foods like bread and rice. He has had episodes where he needs to vomit to clear his esophagus. He can also waited out and will eventually feel  the food bolus pass. After his last visit he says he was referred to Dr. Rip Harbour daily. He had been having a nocturnal cough. He was advised to use Nasonex and to increase his Nexium to twice daily. He increased his Nexium to twice daily and had no further burning in his throat and no nocturnal cough after a period of time, he was able to cut back to once a day Nexium and continued to have good relief of his symptoms. At the beginning of the summer, his insurance changed him to generic Nexium and he began to have recurrent symptoms. More recently he switched back to brand Nexium and has not had breakthrough heartburn or throat irritation. He does state that over the past several months he is having recurrent dysphagia to breads and rice as stated above. He also feels full sooner than normal and feels as if his food does not pass out of his stomach. He states the epigastric area gets bloated and hard especially at night. He has not had postprandial nausea or vomiting. His bowel movements have been regular.   Past Medical History  Diagnosis Date  . Chronic headaches   . GERD (gastroesophageal reflux disease)   . DDD (degenerative disc disease), cervical 2011  . MRSA cellulitis   . Low testosterone     Past Surgical History  Procedure Laterality Date  . Upper gastrointestinal endoscopy  09/22/2010    54 Fr Maloney  dilation for dysphagia, gastritis  . Colonoscopy w/ polypectomy  09/22/2010    2 diminutive adenomas, diverticulosis, ext/int hemorrhoids   Family History  Problem Relation Age of Onset  . Breast cancer Sister   . Stomach cancer Sister   . Heart disease Father   . Stroke Mother   . Heart disease Brother   . Heart attack Brother   . Colon cancer Neg Hx    History  Substance Use Topics  . Smoking status: Former Smoker -- 1.00 packs/day for 30 years    Quit date: 08/11/2010  . Smokeless tobacco: Never Used  . Alcohol Use: No   Current Outpatient Prescriptions  Medication Sig Dispense Refill  . esomeprazole (NEXIUM) 40 MG capsule Take 1 capsule (40 mg total) by mouth 2 (two) times daily before a meal. (Patient taking differently: Take 40 mg by mouth daily. ) 60 capsule 1  . mometasone (NASONEX) 50 MCG/ACT nasal spray Place 2 sprays into the nose daily.    . tadalafil (CIALIS) 20 MG tablet Take 10 mg by mouth daily as needed for erectile dysfunction.     Marland Kitchen testosterone (ANDROGEL) 50 MG/5GM (1%) GEL Place 5 g onto the skin daily. Daily to both shoulders    . metoCLOPramide (REGLAN) 10 MG tablet Take 1 tablet (10 mg total) by mouth at bedtime. 30 tablet 1  . Multiple  Vitamin (MULTIVITAMIN WITH MINERALS) TABS tablet Take 1 tablet by mouth daily. (Patient not taking: Reported on 02/12/2014)     No current facility-administered medications for this visit.   No Known Allergies    Review of Systems: Gen: Denies any fever, chills, sweats, anorexia, fatigue, weakness, malaise, weight loss, and sleep disorder CV: Denies chest pain, angina, palpitations, syncope, orthopnea, PND, peripheral edema, and claudication. Resp: Denies dyspnea at rest, dyspnea with exercise, cough, sputum, wheezing, coughing up blood, and pleurisy. GI: Denies vomiting blood, jaundice, and fecal incontinence.   GU : Denies urinary burning, blood in urine, urinary frequency, urinary hesitancy, nocturnal urination, and  urinary incontinence. MS: Denies joint pain, limitation of movement, and swelling, stiffness, low back pain, extremity pain. Denies muscle weakness, cramps, atrophy.  Derm: Denies rash, itching, dry skin, hives, moles, warts, or unhealing ulcers.  Psych: Denies depression, anxiety, memory loss, suicidal ideation, hallucinations, paranoia, and confusion. Heme: Denies bruising, bleeding, and enlarged lymph nodes. Neuro:  Denies any headaches, dizziness, paresthesia Endo:  Denies any problems with DM, thyroid, adrenal  PROCEDURES: EGD on 03/03/2012 showed evidence of suspected Barrett's. Biopsies were taken which were negative for intestinal metaplasia. The remainder of the upper endoscopy exam was otherwise normal. A 54 French Maloney dilator was passed for dysphagia.    Physical Exam: General: Pleasant, well developed , male in no acute distress Head: Normocephalic and atraumatic Eyes:  sclerae anicteric, conjunctiva pink  Ears: Normal auditory acuity Lungs: Clear throughout to auscultation Heart: Regular rate and rhythm Abdomen: Soft, non distended, non-tender. No masses, no hepatomegaly. Normal bowel sounds Musculoskeletal: Symmetrical with no gross deformities  Extremities: No edema  Neurological: Alert oriented x 4, grossly nonfocal Psychological:  Alert and cooperative. Normal mood and affect  Assessment and Recommendations: 58 year old male with a history of GERD and dysphagia status post EGD in December 2013 with dilation which alleviated his dysphagia. He again is having recurrent dysphagia to dry foods. He has been advised to chew his food carefully and take small sips of fluid as needed. He will continue Nexium 40 mg twice a day 30 minutes before breakfast and supper. As he has been having some postprandial dyspeptic type symptoms and early satiety, a gastroparesis diet has been reviewed with him and he will be given a trial of Reglan 10 mg at at bedtime. He has been informed of  the possible side effects of Reglan including tardive dyskinesia, and has been advised to stop the Reglan immediately if he has any side effects. He will also be scheduled for an EGD with dilation.The risks, benefits, and alternatives to endoscopy with possible biopsy and possible dilation were discussed with the patient and they consent to proceed. The procedure will be scheduled with Dr. Carlean Purl. Further recommendations will be made pending the findings of his upper endoscopy. If he continues to be troubled with dysphagia he may be a candidate for esophageal manometry.   Merlene Dante, Vita Barley PA-C 02/12/2014,

## 2014-02-14 NOTE — Progress Notes (Signed)
Agree w/ Ms. Hvozdovic's note and mangement.  

## 2014-03-18 ENCOUNTER — Telehealth: Payer: Self-pay | Admitting: Internal Medicine

## 2014-03-18 NOTE — Telephone Encounter (Signed)
Spoke with patient and he can't find old instructions.  Will put new ones up front with correct date/time.

## 2014-03-22 ENCOUNTER — Encounter: Payer: 59 | Admitting: Internal Medicine

## 2014-04-04 ENCOUNTER — Ambulatory Visit: Payer: 59 | Admitting: Internal Medicine

## 2014-04-04 ENCOUNTER — Ambulatory Visit (AMBULATORY_SURGERY_CENTER): Payer: 59 | Admitting: Internal Medicine

## 2014-04-04 ENCOUNTER — Encounter: Payer: Self-pay | Admitting: Internal Medicine

## 2014-04-04 ENCOUNTER — Encounter: Payer: Self-pay | Admitting: *Deleted

## 2014-04-04 VITALS — BP 135/63 | HR 60 | Temp 97.5°F | Resp 22 | Ht 69.0 in | Wt 169.0 lb

## 2014-04-04 DIAGNOSIS — K219 Gastro-esophageal reflux disease without esophagitis: Secondary | ICD-10-CM

## 2014-04-04 DIAGNOSIS — R1314 Dysphagia, pharyngoesophageal phase: Secondary | ICD-10-CM

## 2014-04-04 MED ORDER — SODIUM CHLORIDE 0.9 % IV SOLN: 500.0000 mL | INTRAVENOUS | Status: DC

## 2014-04-04 NOTE — Progress Notes (Signed)
Called to room to assist during endoscopic procedure.  Patient ID and intended procedure confirmed with present staff. Received instructions for my participation in the procedure from the performing physician.  

## 2014-04-04 NOTE — Progress Notes (Signed)
Awake to RR

## 2014-04-04 NOTE — Op Note (Signed)
[place]  [address] [place], [address]   ENDOSCOPY PROCEDURE REPORT  PATIENT: John Johnston, John Johnston  MR#: [id number] BIRTHDATE: 1955-04-22 , 31  yrs. old GENDER: male ENDOSCOPIST: Gatha Mayer, MD, [facility] PROCEDURE DATE:  04/04/2014 PROCEDURE:  EGD, diagnostic and Maloney dilation of esophagus ASA CLASS:     Class II INDICATIONS:  dysphagia and epigastric pain. MEDICATIONS: Propofol 130 mg IV, lidocaine 40 mg IV, and Monitored anesthesia care TOPICAL ANESTHETIC: none  DESCRIPTION OF PROCEDURE: After the risks benefits and alternatives of the procedure were thoroughly explained, informed consent was obtained.  The LB [id number] endoscope was introduced through the mouth and advanced to the second portion of the duodenum , Without limitations.  The instrument was slowly withdrawn as the mucosa was fully examined.    Irrgeular Z-line as before (no Barrett's on past biopsies) otherwise normal esophagus, stomach and duodenum.  Retroflexed views revealed no abnormalities.   44 Fr Maloney dilation performed  easily and without heme on the dialtor - some was spit out of mouth after the procedure.   The scope was then withdrawn from the patient and the procedure completed.  COMPLICATIONS: There were no immediate complications.  ENDOSCOPIC IMPRESSION: Irrgeular Z-line as before (no Barrett's on past biopsies) otherwise normal esophagus, stomach and duodenum  RECOMMENDATIONS: 1.  Clear liquids until 3 PM , then soft foods rest of day.  Resume prior diet tomorrow. 2.  Patient to call and schedule follow-up visit (call soon) for 4-6 weeks. If persistent problems will need motility evaluation, Ba swallow and/or esophageal manometry    eSigned:  Gatha Mayer, MD, [facility] 04/04/2014 2:18 PM    CC: The Patient

## 2014-04-04 NOTE — Progress Notes (Signed)
Pt has small cut to upper right side of lip after bite block removed- wife is made aware

## 2014-04-04 NOTE — Patient Instructions (Addendum)
   Things looked ok - I did dilate again and hope that helps. Please come back to see me in 4-6 weeks.  I appreciate the opportunity to care for you. Gatha Mayer, MD, FACG  YOU HAD AN ENDOSCOPIC PROCEDURE TODAY AT [facility]: Refer to the procedure report that was given to you for any specific questions about what was found during the examination.  If the procedure report does not answer your questions, please call your gastroenterologist to clarify.  If you requested that your care partner not be given the details of your procedure findings, then the procedure report has been included in a sealed envelope for you to review at your convenience later.  YOU SHOULD EXPECT: Some feelings of bloating in the abdomen. Passage of more gas than usual.  Walking can help get rid of the air that was put into your GI tract during the procedure and reduce the bloating.   DIET: clear liquids until 3:00 p.m today.  Then, soft foods the rest of today.  Resume normal diet tomorrow. Drink plenty of fluids but you should avoid alcoholic beverages for 24 hours.  ACTIVITY: Your care partner should take you home directly after the procedure.  You should plan to take it easy, moving slowly for the rest of the day.  You can resume normal activity the day after the procedure however you should NOT DRIVE or use heavy machinery for 24 hours (because of the sedation medicines used during the test).    SYMPTOMS TO REPORT IMMEDIATELY: A gastroenterologist can be reached at any hour.  During normal business hours, 8:30 AM to 5:00 PM Monday through Friday, call [phone].  After hours and on weekends, please call the GI answering service at [phone] who will take a message and have the physician on call contact you.   Following upper endoscopy (EGD)  Vomiting of blood or coffee ground material  New chest pain or pain under the shoulder blades  Painful or persistently difficult  swallowing  New shortness of breath  Fever of 100F or higher  Black, tarry-looking stools  FOLLOW UP: Our staff will call the home number listed on your records the next business day following your procedure to check on you and address any questions or concerns that you may have at that time regarding the information given to you following your procedure. This is a courtesy call and so if there is no answer at the home number and we have not heard from you through the emergency physician on call, we will assume that you have returned to your regular daily activities without incident.  SIGNATURES/CONFIDENTIALITY: You and/or your care partner have signed paperwork which will be entered into your electronic medical record.  These signatures attest to the fact that that the information above on your After Visit Summary has been reviewed and is understood.  Full responsibility of the confidentiality of this discharge information lies with you and/or your care-partner.  You might note some irritation in your nose or some drainage.  This may cause feels of congestion.  This is from the oxygen, which can be irritating.  There is no need for concern, this should clear up in a day or so  Continue your normal medications

## 2014-04-08 ENCOUNTER — Telehealth: Payer: Self-pay | Admitting: *Deleted

## 2014-04-08 NOTE — Telephone Encounter (Signed)
  Follow up Call-  Call back number 04/04/2014 03/03/2012  Post procedure Call Back phone  # [phone]  Permission to leave phone message Yes Yes     Patient questions:  Do you have a fever, pain , or abdominal swelling? No. Pain Score  0 *  Have you tolerated food without any problems? Yes.    Have you been able to return to your normal activities? Yes.    Do you have any questions about your discharge instructions: Diet   No. Medications  No. Follow up visit  No.  Do you have questions or concerns about your Care? No.  Actions: * If pain score is 4 or above: No action needed, pain <4.

## 2014-05-07 ENCOUNTER — Ambulatory Visit (INDEPENDENT_AMBULATORY_CARE_PROVIDER_SITE_OTHER): Payer: 59 | Admitting: Internal Medicine

## 2014-05-07 ENCOUNTER — Encounter: Payer: Self-pay | Admitting: Internal Medicine

## 2014-05-07 VITALS — BP 138/78 | HR 78 | Ht 69.0 in | Wt 167.2 lb

## 2014-05-07 DIAGNOSIS — K219 Gastro-esophageal reflux disease without esophagitis: Secondary | ICD-10-CM

## 2014-05-07 DIAGNOSIS — K644 Residual hemorrhoidal skin tags: Secondary | ICD-10-CM

## 2014-05-07 NOTE — Assessment & Plan Note (Signed)
Doing well - no dysphagia Stay on PPI

## 2014-05-07 NOTE — Patient Instructions (Addendum)
Follow up with Dr Carlean Purl as needed.  Continue your Nexium.  If you want your anal skin tags removed you would need to see a surgeon.   I appreciate the opportunity to care for you. Jenean Lindau., [facility]

## 2014-05-07 NOTE — Progress Notes (Signed)
   Subjective:    Patient ID: John Johnston, male    DOB: 1955/05/06, 59 y.o.   MRN: [id number] Cc: F/u reflux, epigastric painand dysphagia HPI The patient returns after EGD and Maloney dilation of esophagus about 1 month ago. He is not having heartburn or epigastric pain as long as he takes PPI.  He has a hx of hemorrhoids and wonders if he is a candidate for hemorrhoid banding. Soma protrusion and rectal bleeding hx. Medications, allergies, past medical history, past surgical history, family history and social history are reviewed and updated in the EMR.  Review of Systems As above    Objective:   Physical Exam BP 138/78 mmHg  Pulse 78  Ht 5\' 9"  (1.753 m)  Wt 167 lb 3.2 oz (75.841 kg)  BMI 24.68 kg/m2 Rectal: moderately large firm circumferential anal tags    Assessment & Plan:  GERD (gastroesophageal reflux disease) Doing well - no dysphagia Stay on PPI   Anal skin tags Would require surgical excision Banding will not treat these.   RTC prn/ 1-2 years

## 2014-05-09 DIAGNOSIS — K644 Residual hemorrhoidal skin tags: Secondary | ICD-10-CM | POA: Insufficient documentation

## 2014-05-09 NOTE — Assessment & Plan Note (Signed)
Would require surgical excision Banding will not treat these.

## 2014-06-19 ENCOUNTER — Emergency Department (HOSPITAL_COMMUNITY)
Admission: EM | Admit: 2014-06-19 | Discharge: 2014-06-19 | Disposition: A | Discharge status: Home / Self Care | Payer: Self-pay | Source: Home / Self Care

## 2014-06-20 ENCOUNTER — Emergency Department (HOSPITAL_COMMUNITY)
Admission: EM | Admit: 2014-06-20 | Discharge: 2014-06-20 | Disposition: A | Discharge status: Home / Self Care | Payer: 59 | Source: Home / Self Care | Attending: Family Medicine | Admitting: Family Medicine

## 2014-06-20 ENCOUNTER — Encounter (HOSPITAL_COMMUNITY): Payer: Self-pay | Admitting: Emergency Medicine

## 2014-06-20 DIAGNOSIS — L739 Follicular disorder, unspecified: Secondary | ICD-10-CM | POA: Diagnosis not present

## 2014-06-20 MED ORDER — DOXYCYCLINE HYCLATE 100 MG PO CAPS: 100.0000 mg | ORAL_CAPSULE | Freq: Two times a day (BID) | ORAL | Status: DC

## 2014-06-20 NOTE — ED Provider Notes (Signed)
John Johnston is a 59 y.o. male who presents to Urgent Care today for leg wound. Patient has developed a sore on his right inner thigh that has been present now for 5 days. It is worsening. No discharge fevers chills nausea vomiting or diarrhea. He is pretty sure he has not been bitten by a tick. He has a history of MRSA abscess in the past. No fevers or chills nausea vomiting or diarrhea.   Past Medical History  Diagnosis Date  . Chronic headaches   . GERD (gastroesophageal reflux disease)   . DDD (degenerative disc disease), cervical 2011  . MRSA cellulitis   . Low testosterone    Past Surgical History  Procedure Laterality Date  . Upper gastrointestinal endoscopy  09/22/2010    54 Fr Maloney dilation for dysphagia, gastritis  . Colonoscopy w/ polypectomy  09/22/2010    2 diminutive adenomas, diverticulosis, ext/int hemorrhoids   History  Substance Use Topics  . Smoking status: Former Smoker -- 1.00 packs/day for 30 years    Quit date: 08/11/2010  . Smokeless tobacco: Never Used  . Alcohol Use: No   ROS as above Medications: No current facility-administered medications for this encounter.   Current Outpatient Prescriptions  Medication Sig Dispense Refill  . esomeprazole (NEXIUM) 40 MG capsule Take 1 capsule (40 mg total) by mouth 2 (two) times daily before a meal. (Patient taking differently: Take 40 mg by mouth daily. ) 60 capsule 1  . ANDROGEL PUMP 20.25 MG/ACT (1.62%) GEL   3  . doxycycline (VIBRAMYCIN) 100 MG capsule Take 1 capsule (100 mg total) by mouth 2 (two) times daily. 20 capsule 0  . mometasone (NASONEX) 50 MCG/ACT nasal spray Place 2 sprays into the nose daily.    . tadalafil (CIALIS) 20 MG tablet Take 10 mg by mouth daily as needed for erectile dysfunction.      No Known Allergies   Exam:  BP 151/86 mmHg  Pulse 74  Temp(Src) 98.9 F (37.2 C) (Oral)  Resp 12  SpO2 99% Gen: Well NAD Skin: 2 cm erythematous indurated circular lesion on right inner thigh with  dark punctate center. No fluctuance palpated. Tender to touch.  No results found for this or any previous visit (from the past 24 hour(s)). No results found.  Assessment and Plan: 59 y.o. male with folliculitis versus early abscess versus tick bite. No fluctuance to drain yet. Treat with doxycycline.  Discussed warning signs or symptoms. Please see discharge instructions. Patient expresses understanding.     Gregor Hams, MD 06/20/14 [phone]

## 2014-06-20 NOTE — Discharge Instructions (Signed)
Thank you for coming in today.  Folliculitis  Folliculitis is redness, soreness, and swelling (inflammation) of the hair follicles. This condition can occur anywhere on the body. People with weakened immune systems, diabetes, or obesity have a greater risk of getting folliculitis. CAUSES  Bacterial infection. This is the most common cause.  Fungal infection.  Viral infection.  Contact with certain chemicals, especially oils and tars. Long-term folliculitis can result from bacteria that live in the nostrils. The bacteria may trigger multiple outbreaks of folliculitis over time. SYMPTOMS Folliculitis most commonly occurs on the scalp, thighs, legs, back, buttocks, and areas where hair is shaved frequently. An early sign of folliculitis is a small, white or yellow, pus-filled, itchy lesion (pustule). These lesions appear on a red, inflamed follicle. They are usually less than 0.2 inches (5 mm) wide. When there is an infection of the follicle that goes deeper, it becomes a boil or furuncle. A group of closely packed boils creates a larger lesion (carbuncle). Carbuncles tend to occur in hairy, sweaty areas of the body. DIAGNOSIS  Your caregiver can usually tell what is wrong by doing a physical exam. A sample may be taken from one of the lesions and tested in a lab. This can help determine what is causing your folliculitis. TREATMENT  Treatment may include:  Applying warm compresses to the affected areas.  Taking antibiotic medicines orally or applying them to the skin.  Draining the lesions if they contain a large amount of pus or fluid.  Laser hair removal for cases of long-lasting folliculitis. This helps to prevent regrowth of the hair. HOME CARE INSTRUCTIONS  Apply warm compresses to the affected areas as directed by your caregiver.  If antibiotics are prescribed, take them as directed. Finish them even if you start to feel better.  You may take over-the-counter medicines to relieve  itching.  Do not shave irritated skin.  Follow up with your caregiver as directed. SEEK IMMEDIATE MEDICAL CARE IF:   You have increasing redness, swelling, or pain in the affected area.  You have a fever. MAKE SURE YOU:  Understand these instructions.  Will watch your condition.  Will get help right away if you are not doing well or get worse. Document Released: 05/10/2001 Document Revised: 08/31/2011 Document Reviewed: 06/01/2011 [facility] Patient Information 2015 [place], [place]. This information is not intended to replace advice given to you by your health care provider. Make sure you discuss any questions you have with your health care provider.

## 2014-06-20 NOTE — ED Notes (Signed)
Reports wound/sore to upper left thigh.  Noticed on Sunday.  Mild drainage this a.m.  Having redness, swelling, and pain.   Pt has tried home remedies with no relief.

## 2014-08-13 ENCOUNTER — Other Ambulatory Visit: Payer: Self-pay | Admitting: Physician Assistant

## 2014-08-13 ENCOUNTER — Telehealth: Payer: Self-pay | Admitting: Internal Medicine

## 2014-08-13 MED ORDER — NEXIUM 40 MG PO CPDR: DELAYED_RELEASE_CAPSULE | ORAL | Status: DC

## 2014-08-13 NOTE — Telephone Encounter (Signed)
rx sent in as requested 

## 2014-11-23 ENCOUNTER — Encounter (HOSPITAL_COMMUNITY): Payer: Self-pay | Admitting: *Deleted

## 2014-11-23 ENCOUNTER — Emergency Department (HOSPITAL_COMMUNITY)
Admission: EM | Admit: 2014-11-23 | Discharge: 2014-11-23 | Disposition: A | Discharge status: Home / Self Care | Payer: 59 | Attending: Emergency Medicine | Admitting: Emergency Medicine

## 2014-11-23 DIAGNOSIS — Z79899 Other long term (current) drug therapy: Secondary | ICD-10-CM | POA: Insufficient documentation

## 2014-11-23 DIAGNOSIS — K219 Gastro-esophageal reflux disease without esophagitis: Secondary | ICD-10-CM | POA: Insufficient documentation

## 2014-11-23 DIAGNOSIS — Z87891 Personal history of nicotine dependence: Secondary | ICD-10-CM | POA: Insufficient documentation

## 2014-11-23 DIAGNOSIS — Z8614 Personal history of Methicillin resistant Staphylococcus aureus infection: Secondary | ICD-10-CM | POA: Diagnosis not present

## 2014-11-23 DIAGNOSIS — M25511 Pain in right shoulder: Secondary | ICD-10-CM | POA: Diagnosis present

## 2014-11-23 DIAGNOSIS — G8929 Other chronic pain: Secondary | ICD-10-CM | POA: Insufficient documentation

## 2014-11-23 DIAGNOSIS — M7521 Bicipital tendinitis, right shoulder: Secondary | ICD-10-CM | POA: Insufficient documentation

## 2014-11-23 MED ORDER — KETOROLAC TROMETHAMINE 60 MG/2ML IM SOLN
60.0000 mg | Freq: Once | INTRAMUSCULAR | Status: AC
  Administered 2014-11-23: 60 mg via INTRAMUSCULAR
  Filled 2014-11-23: qty 2

## 2014-11-23 MED ORDER — OXYCODONE-ACETAMINOPHEN 5-325 MG PO TABS
1.0000 | ORAL_TABLET | Freq: Once | ORAL | Status: AC
  Administered 2014-11-23: 2 via ORAL
  Filled 2014-11-23: qty 2

## 2014-11-23 MED ORDER — DEXAMETHASONE SODIUM PHOSPHATE 10 MG/ML IJ SOLN
10.0000 mg | Freq: Once | INTRAMUSCULAR | Status: AC
  Administered 2014-11-23: 10 mg via INTRAMUSCULAR
  Filled 2014-11-23: qty 1

## 2014-11-23 MED ORDER — LIDOCAINE 5 % EX PTCH: 1.0000 | MEDICATED_PATCH | CUTANEOUS | Status: DC

## 2014-11-23 MED ORDER — ONDANSETRON 4 MG PO TBDP
4.0000 mg | ORAL_TABLET | Freq: Once | ORAL | Status: AC
  Administered 2014-11-23: 4 mg via ORAL
  Filled 2014-11-23: qty 1

## 2014-11-23 MED ORDER — OXYCODONE-ACETAMINOPHEN 10-325 MG PO TABS: 1.0000 | ORAL_TABLET | ORAL | Status: DC | PRN

## 2014-11-23 NOTE — ED Provider Notes (Signed)
CSN: [id number]     Arrival date & time 11/23/14  9379 History  This chart was scribed for non-physician practitioner, Margarita Mail, PA-C, working with Wandra Arthurs, MD, by Stephania Fragmin, ED Scribe. This patient was seen in room TR06C/TR06C and the patient's care was started at 10:39 AM.    Chief Complaint  Patient presents with  . Shoulder Pain   The history is provided by the patient. No language interpreter was used.    HPI Comments: John Johnston is a 59 y.o. male who is right-hand-dominant and with a history of bicep tendinitis, who presents to the Emergency Department complaining of acute on chronic exacerbation of atraumatic right shoulder pain that began yesterday. He had originally planned to see Dr. Apolonio Schneiders for a tendon injection but was unable to see him as an outpatient today. Patient has taken Valium, pain medicine, and Flexeril with no relief. Flexion and wrist pronation/supination exacerbate his pain. Patient has had two injections in the past for his pain. 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    Past Medical History  Diagnosis Date  . Chronic headaches   . GERD (gastroesophageal reflux disease)   . DDD (degenerative disc disease), cervical 2011  . MRSA cellulitis   . Low testosterone    Past Surgical History  Procedure Laterality Date  . Upper gastrointestinal endoscopy  09/22/2010    55 Fr Maloney dilation for dysphagia, gastritis  . Colonoscopy w/ polypectomy  09/22/2010    2 diminutive adenomas, diverticulosis, ext/int hemorrhoids   Family History  Problem Relation Age of Onset  . Breast cancer Sister   . Stomach cancer Sister   . Heart disease Father   . Stroke Mother   . Heart disease Brother   . Heart attack Brother   . Colon cancer Neg Hx    Social History  Substance Use Topics  . Smoking status: Former Smoker -- 1.00 packs/day for 30 years    Quit date: 08/11/2010  . Smokeless tobacco: Never Used  . Alcohol Use: No    Review of Systems   Musculoskeletal: Positive for arthralgias (right shoulder pain).     Allergies  Review of patient's allergies indicates no known allergies.  Home Medications   Prior to Admission medications   Medication Sig Start Date End Date Taking? Authorizing Provider  ANDROGEL PUMP 20.25 MG/ACT (1.62%) GEL  01/11/14   Historical Provider, MD  doxycycline (VIBRAMYCIN) 100 MG capsule Take 1 capsule (100 mg total) by mouth 2 (two) times daily. 06/20/14   Gregor Hams, MD  lidocaine (LIDODERM) 5 % Place 1 patch onto the skin daily. Remove & Discard patch within 12 hours or as directed by MD 11/23/14   Margarita Mail, PA-C  mometasone (NASONEX) 50 MCG/ACT nasal spray Place 2 sprays into the nose daily.    Historical Provider, MD  NEXIUM 40 MG capsule TAKE 1 CAPSULE BY MOUTH TWICE DAILY BEFORE MEALS 08/13/14   Gatha Mayer, MD  oxyCODONE-acetaminophen (PERCOCET) 10-325 MG per tablet Take 1 tablet by mouth every 4 (four) hours as needed for pain. 11/23/14   Margarita Mail, PA-C  tadalafil (CIALIS) 20 MG tablet Take 10 mg by mouth daily as needed for erectile dysfunction.     Historical Provider, MD   BP 132/84 mmHg  Pulse 80  Temp(Src) 98.1 F (36.7 C) (Oral)  Resp 16  Ht 5' 9.5" (1.765 m)  Wt 163 lb (73.936 kg)  BMI 23.73 kg/m2  SpO2 100% Physical Exam  Constitutional: He is oriented to person, place, and time. He appears well-developed and well-nourished. No distress.  HENT:  Head: Normocephalic and atraumatic.  Eyes: Conjunctivae and EOM are normal.  Neck: Neck supple. No tracheal deviation present.  Cardiovascular: Normal rate.   Pulmonary/Chest: Effort normal. No respiratory distress.  Musculoskeletal: Normal range of motion.  TTP over the short head of the biceps. Unable to flex shoulder due to pain.   Neurological: He is alert and oriented to person, place, and time.  Skin: Skin is warm and dry.  Psychiatric: He has a normal mood and affect. His behavior is normal.  Nursing note and  vitals reviewed.   ED Course  Procedures (including critical care time)  DIAGNOSTIC STUDIES: Oxygen Saturation is 98% on RA, normal by my interpretation.    COORDINATION OF CARE: 10:41 AM - Discussed treatment plan with pt at bedside which includes consult with Dr. Apolonio Schneiders. Pt verbalized understanding and agreed to plan.   12:34 PM - Have not yet heard back from Dr. Apolonio Schneiders after sending a consult 40+ minutes ago. Discussed treatment plan with pt at bedside, which includes Decadron injection and Toradol injection administered here today. Pt verbalized understanding and agreed to plan.   MDM   Final diagnoses:  Biceps tendinitis on right    Patient told to come to the ED for injection by Dr Wynelle Link by office staff at [facility]. I was unable to get a response from him although I did attempt to page him twice on behalf of the patient. This appears to be a flair of the patient's bicep tendonitis. Treated pain here in ED. Sling for comfort. ICE and f/u with Dr. Caralyn Guile on Monday. I personally performed the services described in this documentation, which was scribed in my presence. The recorded information has been reviewed and is accurate.       Margarita Mail, PA-C 11/30/14 0131  Wandra Arthurs, MD 12/13/14 [phone]

## 2014-11-23 NOTE — Discharge Instructions (Signed)
Please apply the Lidoderm patch over the area of the most pain. You may wear it up to 12 hours.\  Apply ice frequently (at least 3-4 times a day.)  Biceps Tendon Tendinitis (Proximal) and Tenosynovitis with Rehab Tendonitis and tenosynovitis involve inflammation of the tendon and the tendon lining (sheath). The proximal biceps tendon is vulnerable to tendonitis and tenosynovitis, which causes pain and discomfort in the front of the shoulder and upper arm. The tendon lining secretes a fluid that helps lubricate the tendon, allowing for proper function without pain. When the tendon and its lining become inflamed, the tendon can no longer glide smoothly, causing pain. The proximal biceps tendon connects the biceps muscle to two bones of the shoulder. It is important for proper function of the elbow and turning the palm upward (supination) using the wrist. Proximal biceps tendon tendinitis may include a grade 1 or 2 strain of the tendon. Grade 1 strains involve a slight pull of the tendon without signs of tearing and no observed tendon lengthening. There is also no loss of strength. Grade 2 strains involve small tears in the tendon fibers. The tendon or muscle is stretched and strength is usually decreased.  SYMPTOMS   Pain, tenderness, swelling, warmth, or redness over the front of the shoulder.  Pain that gets worse with shoulder and elbow use, especially against resistance.  Limited motion of the shoulder or elbow.  Crackling sound (crepitation) when the tendon or shoulder is moved or touched. CAUSES  The symptoms of biceps tendonitis are due to inflammation of the tendon. Inflammation may be caused by:  Strain from sudden increase in amount or intensity of activity.  Direct blow or injury to the elbow (uncommon).  Overuse or repetitive elbow bending or wrist rotation, particularly when turning the palm up, or with elbow hyperextension. RISK INCREASES WITH:  Sports that involve contact or  overhead arm activity (throwing sports, gymnastics, weightlifting, bodybuilding, rock climbing).  Heavy labor.  Poor strength and flexibility.  Failure to warm up properly before activity. PREVENTION  Warm up and stretch properly before activity.  Allow time for recovery between activities.  Maintain physical fitness:  Strength, flexibility, and endurance.  Cardiovascular fitness.  Learn and use proper exercise technique. PROGNOSIS  With proper treatment, proximal biceps tendon tendonitis and tenosynovitis is usually curable within 6 weeks. Healing is usually quicker if the cause was a direct blow, not overuse.  RELATED COMPLICATIONS   Longer healing time if not properly treated or if not given enough time to heal.  Chronically inflamed tendon that causes persistent pain with activity, that may progress to constant pain and potentially rupture of the tendon.  Recurring symptoms, especially if activity is resumed too soon or with overuse, a direct blow, or use of poor exercise technique. TREATMENT Treatment first involves ice and medicine, to reduce pain and inflammation. It is helpful to modify activities that cause pain, to reduce the chances of causing the condition to get worse. Strengthening and stretching exercises should be performed to promote proper use of the muscles of the shoulder. These exercises may be performed at home or with a therapist. Other treatments may be given such as ultrasound or heat therapy. A corticosteroid injection may be recommended to help reduce inflammation of the tendon lining. Surgery is usually not necessary. Sometimes, if symptoms last for greater than 6 months, surgery will be advised to detach the tendon and re-insert it into the arm bone. Surgery to correct other shoulder problems that may be  contributing to tendinitis may be advised before surgery for the tendinitis itself.  MEDICATION  If pain medicine is needed, nonsteroidal  anti-inflammatory medicines (aspirin and ibuprofen), or other minor pain relievers (acetaminophen), are often advised.  Do not take pain medicine for 7 days before surgery.  Prescription pain relievers may be given if your caregiver thinks they are needed. Use only as directed and only as much as you need.  Corticosteroid injections may be given. These injections should only be used on the most severe cases, as one can only receive a limited number of them. HEAT AND COLD   Cold treatment (icing) should be applied for 10 to 15 minutes every 2 to 3 hours for inflammation and pain, and immediately after activity that aggravates your symptoms. Use ice packs or an ice massage.  Heat treatment may be used before performing stretching and strengthening activities prescribed by your caregiver, physical therapist, or athletic trainer. Use a heat pack or a warm water soak. SEEK MEDICAL CARE IF:   Symptoms get worse or do not improve in 2 weeks, despite treatment.  New, unexplained symptoms develop. (Drugs used in treatment may produce side effects.) EXERCISES RANGE OF MOTION (ROM) AND EXERCISES - Biceps Tendon (Proximal) and Tenosynovitis These exercises may help you when beginning to rehabilitate your injury. Your symptoms may go away with or without further involvement from your physician, physical therapist, or athletic trainer. While completing these exercises, remember:   Restoring tissue flexibility helps normal motion to return to the joints. This allows healthier, less painful movement and activity.  An effective stretch should be held for at least 30 seconds.  A stretch should never be painful. You should only feel a gentle lengthening or release in the stretched tissue. STRETCH - Flexion, Standing  Stand with good posture. With an underhand grip on your right / left hand and an overhand grip on the opposite hand, grasp a broomstick or cane so that your hands are a little more than  shoulder width apart.  Keeping your right / left elbow straight and shoulder muscles relaxed, push the stick with your opposite hand to raise your right / left arm in front of your body and then overhead. Raise your arm until you feel a stretch in your right / left shoulder, but before you have increased shoulder pain.  Try to avoid shrugging your right / left shoulder as your arm rises, by keeping your shoulder blade tucked down and toward your mid-back spine. Hold for __________ seconds.  Slowly return to the starting position. Repeat __________ times. Complete this exercise __________ times per day. STRETCH - Abduction, Supine  Lie on your back. With an underhand grip on your right / left hand and an overhand grip on the opposite hand, grasp a broomstick or cane so that your hands are a little more than shoulder width apart.  Keeping your right / left elbow straight and shoulder muscles relaxed, push the stick with your opposite hand to raise your right / left arm out to the side of your body and then overhead. Raise your arm until you feel a stretch in your right / left shoulder, but before you have increased shoulder pain.  Try to avoid shrugging your right / left shoulder as your arm rises, by keeping your shoulder blade tucked down and toward your mid-back spine. Hold for __________ seconds.  Slowly return to the starting position. Repeat __________ times. Complete this exercise __________ times per day. ROM - Flexion, Active-Assisted  Lie on your back. You may bend your knees for comfort.  Grasp a broomstick or cane so your hands are about shoulder width apart. Your right / left hand should grip the end of the stick so that your hand is positioned "thumbs-up," as if you were about to shake hands.  Using your healthy arm to lead, raise your right / left arm overhead until you feel a gentle stretch in your shoulder. Hold for __________ seconds.  Use the stick to assist in returning  your right / left arm to its starting position. Repeat __________ times. Complete this exercise __________ times per day.  STRETCH - Flexion, Standing   Stand facing a wall. Walk your right / left fingers up the wall until you feel a moderate stretch in your shoulder. As your hand gets higher, you may need to step closer to the wall or use a door frame to walk through.  Try to avoid shrugging your right / left shoulder as your arm rises, by keeping your shoulder blade tucked down and toward your mid-back spine.  Hold for __________ seconds. Use your other hand, if needed, to ease out of the stretch and return to the starting position. Repeat __________ times. Complete this exercise __________ times per day.  ROM - Internal Rotation   Using underhand grips, grasp a stick behind your back with both hands.  While standing upright with good posture, slide the stick up your back until you feel a mild stretch in the front of your shoulder.  Hold for __________ seconds. Slowly return to your starting position. Repeat __________ times. Complete this exercise __________ times per day.  STRETCH - Internal Rotation  Place your right / left hand behind your back, palm-up.  Throw a towel or belt over your opposite shoulder. Grasp the towel with your right / left hand.  While keeping an upright posture, gently pull up on the towel until you feel a stretch in the front of your right / left shoulder.  Avoid shrugging your right / left shoulder as your arm rises, by keeping your shoulder blade tucked down and toward your mid-back spine.  Hold for __________ seconds. Release the stretch by lowering your opposite hand. Repeat __________ times. Complete this exercise __________ times per day. STRENGTHENING EXERCISES - Biceps Tendon Tendinitis (Proximal) and Tenosynovitis These exercises may help you regain your strength after your physician has discontinued your restraint in a cast or brace. They may  resolve your symptoms with or without further involvement from your physician, physical therapist or athletic trainer. While completing these exercises, remember:   Muscles can gain both the endurance and the strength needed for everyday activities through controlled exercises.  Complete these exercises as instructed by your physician, physical therapist or athletic trainer. Increase the resistance and repetitions only as guided.  You may experience muscle soreness or fatigue, but the pain or discomfort you are trying to eliminate should never worsen during these exercises. If this pain does get worse, stop and make sure you are following the directions exactly. If the pain is still present after adjustments, discontinue the exercise until you can discuss the trouble with your caregiver. STRENGTH - Elbow Flexors, Isometric  Stand or sit upright on a firm surface. Place your right / left arm so that your hand is palm-up and at the height of your waist.  Place your opposite hand on top of your forearm. Gently push down as your right / left arm resists. Push as hard as  you can with both arms, without causing any pain or movement at your right / left elbow. Hold this stationary position for __________ seconds.  Gradually release the tension in both arms. Allow your muscles to relax completely before repeating. Repeat __________ times. Complete this exercise __________ times per day. STRENGTH - Shoulder Flexion, Isometric  With good posture and facing a wall, stand or sit about 4-6 inches away.  Keeping your right / left elbow straight, gently press the top of your fist into the wall. Increase the pressure gradually until you are pressing as hard as you can, without shrugging your shoulder or increasing any shoulder discomfort.  Hold for __________ seconds.  Release the tension slowly. Relax your shoulder muscles completely before you start the next repetition. Repeat __________ times. Complete  this exercise __________ times per day.  STRENGTH - Elbow Flexors, Supinated  With good posture, stand or sit on a firm chair without armrests. Allow your right / left arm to rest at your side with your palm facing forward.  Holding a __________ weight, or gripping a rubber exercise band or tubing,  bring your hand toward your shoulder.  Allow your muscles to control the resistance as your hand returns to your side. Repeat __________ times. Complete this exercise __________ times per day.  STRENGTH - Shoulder Flexion  Stand or sit with good posture. Grasp a __________ weight, or an exercise band or tubing, so that your hand is "thumbs-up," like when you shake hands.  Slowly lift your right / left arm as far as you can, without increasing any shoulder pain. At first, many people can only raise their hand to shoulder height.  Avoid shrugging your right / left shoulder as your arm rises, by keeping your shoulder blade tucked down and toward your mid-back spine.  Hold for __________ seconds. Control the descent of your hand as you slowly return to your starting position. Repeat __________ times. Complete this exercise __________ times per day. Document Released: 03/01/2005 Document Revised: 05/24/2011 Document Reviewed: 06/13/2008 [facility] Patient Information 2015 [place], [place]. This information is not intended to replace advice given to you by your health care provider. Make sure you discuss any questions you have with your health care provider.

## 2014-11-23 NOTE — ED Notes (Signed)
Pt in from home c/o R shoulder pain onset yesterday, pt denies injury, +PS, pt c/o Limited ROM d/t pain, pt hx of bicep tendinitis, - swelling, A&O x4

## 2014-12-13 ENCOUNTER — Telehealth: Payer: Self-pay | Admitting: Internal Medicine

## 2014-12-13 DIAGNOSIS — R1310 Dysphagia, unspecified: Secondary | ICD-10-CM

## 2014-12-13 NOTE — Telephone Encounter (Signed)
He is having hoarseness and a sense of something in his throat he needs to clear. Also still having some dysphagia.  Had seen Dr. Erik Obey 1 year ago and went to Nexium 40 mg bid and Nasocrt and he had improved until recently.   Advised let Dr. Erik Obey see him and we will arrange Ba swallow with tablet to evaluate dysphagia

## 2014-12-13 NOTE — Telephone Encounter (Signed)
Ba Swallow  Appointment set up for 12/20/2014 at 12:30pm, arrive at 12:15pm at [facility].  NPO 3 hours.  Wife Pam informed of date and time.  She is going to set up the Dr Erik Obey appointment for after this test.

## 2014-12-20 ENCOUNTER — Ambulatory Visit (HOSPITAL_COMMUNITY)
Admission: RE | Admit: 2014-12-20 | Discharge: 2014-12-20 | Disposition: A | Discharge status: Home / Self Care | Payer: 59 | Source: Ambulatory Visit | Attending: Internal Medicine | Admitting: Internal Medicine

## 2014-12-20 DIAGNOSIS — R1310 Dysphagia, unspecified: Secondary | ICD-10-CM | POA: Diagnosis present

## 2014-12-20 DIAGNOSIS — K449 Diaphragmatic hernia without obstruction or gangrene: Secondary | ICD-10-CM | POA: Diagnosis not present

## 2014-12-21 NOTE — Progress Notes (Signed)
Quick Note:  Let patient or wife [facility] RN) know that the esophagus is not squeezing properly. Needs esophageal manometry please re: dysphagia and esophageal dysmotility Dx Please explain the manometry to him ______

## 2015-01-14 ENCOUNTER — Encounter: Payer: Self-pay | Admitting: Internal Medicine

## 2015-01-20 ENCOUNTER — Ambulatory Visit (HOSPITAL_COMMUNITY)
Admission: RE | Admit: 2015-01-20 | Discharge: 2015-01-20 | Disposition: A | Discharge status: Home / Self Care | Payer: 59 | Source: Ambulatory Visit | Attending: Internal Medicine | Admitting: Internal Medicine

## 2015-01-20 ENCOUNTER — Encounter (HOSPITAL_COMMUNITY)
Admission: RE | Disposition: A | Discharge status: Home / Self Care | Payer: Self-pay | Source: Ambulatory Visit | Attending: Internal Medicine

## 2015-01-20 DIAGNOSIS — K219 Gastro-esophageal reflux disease without esophagitis: Secondary | ICD-10-CM | POA: Insufficient documentation

## 2015-01-20 DIAGNOSIS — R1310 Dysphagia, unspecified: Secondary | ICD-10-CM | POA: Insufficient documentation

## 2015-01-20 HISTORY — PX: ESOPHAGEAL MANOMETRY: SHX5429

## 2015-01-20 SURGERY — MANOMETRY, ESOPHAGUS
Anesthesia: Topical

## 2015-01-20 MED ORDER — LIDOCAINE VISCOUS 2 % MT SOLN
OROMUCOSAL | Status: AC
  Filled 2015-01-20: qty 15

## 2015-01-20 SURGICAL SUPPLY — 2 items
FACESHIELD LNG OPTICON STERILE (SAFETY) IMPLANT
GLOVE BIO SURGEON STRL SZ8 (GLOVE) ×6 IMPLANT

## 2015-01-21 ENCOUNTER — Encounter (HOSPITAL_COMMUNITY): Payer: Self-pay | Admitting: Internal Medicine

## 2015-01-28 ENCOUNTER — Telehealth: Payer: Self-pay | Admitting: Internal Medicine

## 2015-01-28 ENCOUNTER — Encounter: Payer: Self-pay | Admitting: Internal Medicine

## 2015-01-28 DIAGNOSIS — K224 Dyskinesia of esophagus: Secondary | ICD-10-CM | POA: Insufficient documentation

## 2015-01-28 NOTE — Telephone Encounter (Signed)
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 He had been more concerned about the hoarseness he had which is improving. He had a lot of mucus and things come up and perhaps he did have some laryngeal irritation from that.  His dysphagia is not frequent, he's had rare chest tightness. We talked about possibly treating with medication but decided to follow and use dietary manipulation and possible warm water or fluid ingestion if he gets dysphagia and the call back if needed otherwise see me in January or February for a follow-up.  He has been on twice a day Nexium prescribed by ENT for some hoarseness etc., we asre going to see if he can cut down to one a day at this point.

## 2015-03-21 MED FILL — ANDROGEL 1.62% GEL PUMP: 20.25 MG/AC | 30 days supply | Qty: 75 | Fill #0

## 2015-03-21 MED FILL — NexIUM 40 MG CPDR: 40 | 30 days supply | Qty: 60 | Fill #4

## 2015-03-21 MED FILL — MOMETASONE FUROATE 50 MCG S: 50 | 30 days supply | Qty: 17 | Fill #4

## 2015-04-09 DIAGNOSIS — M7541 Impingement syndrome of right shoulder: Secondary | ICD-10-CM | POA: Diagnosis not present

## 2015-04-10 DIAGNOSIS — K219 Gastro-esophageal reflux disease without esophagitis: Secondary | ICD-10-CM | POA: Insufficient documentation

## 2015-04-10 DIAGNOSIS — R1310 Dysphagia, unspecified: Secondary | ICD-10-CM | POA: Insufficient documentation

## 2015-04-29 MED FILL — ANDROGEL 1.62% GEL PUMP: 20.25 MG/AC | 30 days supply | Qty: 75 | Fill #1

## 2015-04-29 MED FILL — CIALIS 20 MG TABLET: 20 | 30 days supply | Qty: 6 | Fill #0

## 2015-06-10 ENCOUNTER — Telehealth: Payer: Self-pay | Admitting: Internal Medicine

## 2015-06-10 MED FILL — ANDROGEL 1.62% GEL PUMP: 20.25 MG/AC | 30 days supply | Qty: 75 | Fill #2

## 2015-06-10 MED FILL — MOMETASONE FUROATE 50 MCG S: 50 | 30 days supply | Qty: 17 | Fill #5

## 2015-06-10 NOTE — Telephone Encounter (Signed)
Please advise 

## 2015-06-11 MED ORDER — PANTOPRAZOLE SODIUM 40 MG PO TBEC: 40.0000 mg | DELAYED_RELEASE_TABLET | Freq: Every day | ORAL | Status: DC

## 2015-06-11 MED FILL — PANTOPRAZOLE SOD DR 40 MG T: 40 | 90 days supply | Qty: 90 | Fill #0

## 2015-06-11 NOTE — Telephone Encounter (Signed)
Unless he does not get help from pantoprazole (I reviewed chart and did not see evidence to this effect) then Rx pantoprazole 40 mg QAM before breakfast # 90 3 RF  This should be free, I think

## 2015-06-11 NOTE — Telephone Encounter (Signed)
Spoke with Pam (wife) and told her the plan, she will pick up the rx tomorrow for John Johnston to try.

## 2015-06-13 MED FILL — CIALIS 20 MG TABLET: 20 | 30 days supply | Qty: 6 | Fill #1

## 2015-07-04 DIAGNOSIS — M7541 Impingement syndrome of right shoulder: Secondary | ICD-10-CM | POA: Diagnosis not present

## 2015-07-07 ENCOUNTER — Encounter: Payer: Self-pay | Admitting: Family

## 2015-07-07 ENCOUNTER — Ambulatory Visit (INDEPENDENT_AMBULATORY_CARE_PROVIDER_SITE_OTHER): Payer: 59 | Admitting: Family

## 2015-07-07 ENCOUNTER — Telehealth: Payer: Self-pay | Admitting: Family

## 2015-07-07 ENCOUNTER — Other Ambulatory Visit (INDEPENDENT_AMBULATORY_CARE_PROVIDER_SITE_OTHER): Payer: 59

## 2015-07-07 VITALS — BP 144/92 | HR 65 | Temp 98.1°F | Resp 16 | Ht 69.5 in | Wt 170.0 lb

## 2015-07-07 DIAGNOSIS — Z0000 Encounter for general adult medical examination without abnormal findings: Secondary | ICD-10-CM

## 2015-07-07 DIAGNOSIS — Z5181 Encounter for therapeutic drug level monitoring: Secondary | ICD-10-CM

## 2015-07-07 LAB — COMPREHENSIVE METABOLIC PANEL
ALK PHOS: 47 U/L (ref 39–117)
ALT: 22 U/L (ref 0–53)
AST: 17 U/L (ref 0–37)
Albumin: 4.4 g/dL (ref 3.5–5.2)
BILIRUBIN TOTAL: 0.4 mg/dL (ref 0.2–1.2)
BUN: 13 mg/dL (ref 6–23)
CO2: 29 mEq/L (ref 19–32)
CREATININE: 0.95 mg/dL (ref 0.40–1.50)
Calcium: 9.7 mg/dL (ref 8.4–10.5)
Chloride: 104 mEq/L (ref 96–112)
GFR: 86.38 mL/min (ref >60.00)
Glucose, Bld: 107 mg/dL — ABNORMAL HIGH (ref 70–99)
Potassium: 5.2 mEq/L — ABNORMAL HIGH (ref 3.5–5.1)
SODIUM: 142 meq/L (ref 135–145)
Total Protein: 7.5 g/dL (ref 6.0–8.3)

## 2015-07-07 LAB — LIPID PANEL
CHOLESTEROL: 224 mg/dL — AB (ref 0–200)
HDL: 44 mg/dL (ref >39.00)
LDL CALC: 155 mg/dL — AB (ref 0–99)
NonHDL: 180.34
Total CHOL/HDL Ratio: 5
Triglycerides: 126 mg/dL (ref 0.0–149.0)
VLDL: 25.2 mg/dL (ref 0.0–40.0)

## 2015-07-07 LAB — VITAMIN D 25 HYDROXY (VIT D DEFICIENCY, FRACTURES): VITD: 34.06 ng/mL (ref 30.00–100.00)

## 2015-07-07 LAB — TSH: TSH: 1.15 u[IU]/mL (ref 0.35–4.50)

## 2015-07-07 LAB — CBC
HCT: 43.8 % (ref 39.0–52.0)
Hemoglobin: 15 g/dL (ref 13.0–17.0)
MCHC: 34.2 g/dL (ref 30.0–36.0)
MCV: 87 fl (ref 78.0–100.0)
Platelets: 233 10*3/uL (ref 150.0–400.0)
RBC: 5.04 Mil/uL (ref 4.22–5.81)
RDW: 13.4 % (ref 11.5–15.5)
WBC: 5.8 10*3/uL (ref 4.0–10.5)

## 2015-07-07 LAB — HEPATITIS C ANTIBODY: HCV AB: NEGATIVE

## 2015-07-07 LAB — TESTOSTERONE: TESTOSTERONE: 379.2 ng/dL (ref 300.00–890.00)

## 2015-07-07 NOTE — Progress Notes (Signed)
Pre visit review using our clinic review tool, if applicable. No additional management support is needed unless otherwise documented below in the visit note. 
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
## 2015-07-07 NOTE — Assessment & Plan Note (Signed)
1) Anticipatory Guidance: Discussed importance of wearing a seatbelt while driving and not texting while driving; changing batteries in smoke detector at least once annually; wearing suntan lotion when outside; eating a balanced and moderate diet; getting physical activity at least 30 minutes per day.  2) Immunizations / Screenings / Labs:  All immunizations are up-to-date per recommendations. Obtain Hepatitis C antibody for Hepatitis C screening. Due for a dental and vision screen encouraged to be completed independently. Obtain testosterone for therapeutic drug monitoring. Obtain Vitamin D for Vitamin D deficiency. All other screenings are up to date per recommendations.  Overall well exam with risk factors for cardiovascular disease including low testosterone testosterone replacement therapy. Encouraged a nutritional intake that is moderate, balance, and varied and focused on nutrient dense foods and low in saturated fats and processed/sugary foods. Encouraged physical activity goal of 30 minutes of moderate level activity daily. Continue other healthy lifestyle behaviors and choices. Follow-up prevention exam in 1 year. Follow-up office visit for chronic conditions pending blood work.  Obtain CBC, CMET, Lipid profile and TSH.

## 2015-07-07 NOTE — Patient Instructions (Signed)
Thank you for choosing [facility].  Summary/Instructions:  Please stop by the lab on the basement level of the building for your blood work. Your results will be released to MyChart (or called to you) after review, usually within 72 hours after test completion. If any changes need to be made, you will be notified at that same time.  Health Maintenance, Male A healthy lifestyle and preventative care can promote health and wellness.  Maintain regular health, dental, and eye exams.  Eat a healthy diet. Foods like vegetables, fruits, whole grains, low-fat dairy products, and lean protein foods contain the nutrients you need and are low in calories. Decrease your intake of foods high in solid fats, added sugars, and salt. Get information about a proper diet from your health care provider, if necessary.  Regular physical exercise is one of the most important things you can do for your health. Most adults should get at least 150 minutes of moderate-intensity exercise (any activity that increases your heart rate and causes you to sweat) each week. In addition, most adults need muscle-strengthening exercises on 2 or more days a week.   Maintain a healthy weight. The body mass index (BMI) is a screening tool to identify possible weight problems. It provides an estimate of body fat based on height and weight. Your health care provider can find your BMI and can help you achieve or maintain a healthy weight. For males 20 years and older:  A BMI below 18.5 is considered underweight.  A BMI of 18.5 to 24.9 is normal.  A BMI of 25 to 29.9 is considered overweight.  A BMI of 30 and above is considered obese.  Maintain normal blood lipids and cholesterol by exercising and minimizing your intake of saturated fat. Eat a balanced diet with plenty of fruits and vegetables. Blood tests for lipids and cholesterol should begin at age 20 and be repeated every 5 years. If your lipid or cholesterol levels are  high, you are over age 50, or you are at high risk for heart disease, you may need your cholesterol levels checked more frequently.Ongoing high lipid and cholesterol levels should be treated with medicines if diet and exercise are not working.  If you smoke, find out from your health care provider how to quit. If you do not use tobacco, do not start.  Lung cancer screening is recommended for adults aged 55-80 years who are at high risk for developing lung cancer because of a history of smoking. A yearly low-dose CT scan of the lungs is recommended for people who have at least a 30-pack-year history of smoking and are current smokers or have quit within the past 15 years. A pack year of smoking is smoking an average of 1 pack of cigarettes a day for 1 year (for example, a 30-pack-year history of smoking could mean smoking 1 pack a day for 30 years or 2 packs a day for 15 years). Yearly screening should continue until the smoker has stopped smoking for at least 15 years. Yearly screening should be stopped for people who develop a health problem that would prevent them from having lung cancer treatment.  If you choose to drink alcohol, do not have more than 2 drinks per day. One drink is considered to be 12 oz (360 mL) of beer, 5 oz (150 mL) of wine, or 1.5 oz (45 mL) of liquor.  Avoid the use of street drugs. Do not share needles with anyone. Ask for help if you need   support or instructions about stopping the use of drugs.  High blood pressure causes heart disease and increases the risk of stroke. High blood pressure is more likely to develop in:  People who have blood pressure in the end of the normal range (100-139/85-89 mm Hg).  People who are overweight or obese.  People who are African American.  If you are 18-39 years of age, have your blood pressure checked every 3-5 years. If you are 40 years of age or older, have your blood pressure checked every year. You should have your blood pressure  measured twice--once when you are at a hospital or clinic, and once when you are not at a hospital or clinic. Record the average of the two measurements. To check your blood pressure when you are not at a hospital or clinic, you can use:  An automated blood pressure machine at a pharmacy.  A home blood pressure monitor.  If you are 45-79 years old, ask your health care provider if you should take aspirin to prevent heart disease.  Diabetes screening involves taking a blood sample to check your fasting blood sugar level. This should be done once every 3 years after age 45 if you are at a normal weight and without risk factors for diabetes. Testing should be considered at a younger age or be carried out more frequently if you are overweight and have at least 1 risk factor for diabetes.  Colorectal cancer can be detected and often prevented. Most routine colorectal cancer screening begins at the age of 50 and continues through age 75. However, your health care provider may recommend screening at an earlier age if you have risk factors for colon cancer. On a yearly basis, your health care provider may provide home test kits to check for hidden blood in the stool. A small camera at the end of a tube may be used to directly examine the colon (sigmoidoscopy or colonoscopy) to detect the earliest forms of colorectal cancer. Talk to your health care provider about this at age 50 when routine screening begins. A direct exam of the colon should be repeated every 5-10 years through age 75, unless early forms of precancerous polyps or small growths are found.  People who are at an increased risk for hepatitis B should be screened for this virus. You are considered at high risk for hepatitis B if:  You were born in a country where hepatitis B occurs often. Talk with your health care provider about which countries are considered high risk.  Your parents were born in a high-risk country and you have not received a  shot to protect against hepatitis B (hepatitis B vaccine).  You have HIV or AIDS.  You use needles to inject street drugs.  You live with, or have sex with, someone who has hepatitis B.  You are a man who has sex with other men (MSM).  You get hemodialysis treatment.  You take certain medicines for conditions like cancer, organ transplantation, and autoimmune conditions.  Hepatitis C blood testing is recommended for all people born from 1945 through 1965 and any individual with known risk factors for hepatitis C.  Healthy men should no longer receive prostate-specific antigen (PSA) blood tests as part of routine cancer screening. Talk to your health care provider about prostate cancer screening.  Testicular cancer screening is not recommended for adolescents or adult males who have no symptoms. Screening includes self-exam, a health care provider exam, and other screening tests. Consult with your   health care provider about any symptoms you have or any concerns you have about testicular cancer.  Practice safe sex. Use condoms and avoid high-risk sexual practices to reduce the spread of sexually transmitted infections (STIs).  You should be screened for STIs, including gonorrhea and chlamydia if:  You are sexually active and are younger than 24 years.  You are older than 24 years, and your health care provider tells you that you are at risk for this type of infection.  Your sexual activity has changed since you were last screened, and you are at an increased risk for chlamydia or gonorrhea. Ask your health care provider if you are at risk.  If you are at risk of being infected with HIV, it is recommended that you take a prescription medicine daily to prevent HIV infection. This is called pre-exposure prophylaxis (PrEP). You are considered at risk if:  You are a man who has sex with other men (MSM).  You are a heterosexual man who is sexually active with multiple partners.  You take  drugs by injection.  You are sexually active with a partner who has HIV.  Talk with your health care provider about whether you are at high risk of being infected with HIV. If you choose to begin PrEP, you should first be tested for HIV. You should then be tested every 3 months for as long as you are taking PrEP.  Use sunscreen. Apply sunscreen liberally and repeatedly throughout the day. You should seek shade when your shadow is shorter than you. Protect yourself by wearing long sleeves, pants, a wide-brimmed hat, and sunglasses year round whenever you are outdoors.  Tell your health care provider of new moles or changes in moles, especially if there is a change in shape or color. Also, tell your health care provider if a mole is larger than the size of a pencil eraser.  A one-time screening for abdominal aortic aneurysm (AAA) and surgical repair of large AAAs by ultrasound is recommended for men aged 65-75 years who are current or former smokers.  Stay current with your vaccines (immunizations).   This information is not intended to replace advice given to you by your health care provider. Make sure you discuss any questions you have with your health care provider.   Document Released: 08/28/2007 Document Revised: 03/22/2014 Document Reviewed: 07/27/2010 Elsevier Interactive Patient Education 2016 [employer]  

## 2015-07-07 NOTE — Telephone Encounter (Signed)
Please inform patient that his Hepatitis C is negative. His thyroid function, white/red blood cells, Vitamin D, kidney function, liver function and white/red blood cells are within the normal ranges. His cholesterol is elevated with a LDL or bad cholesterol with a goal of <100. Based on calculations for risk for coronary artery disease risk in the next 10 years, there are 2 recommendations at this time. The first would be to start cholesterol medication to help reduce his risk. The second is to start a low dose aspirin at 81 mg to help alleviate some of the risk for coronary artery disease in the future. I would recommend atorvastatin, Crestor or pravastatin. Please let me know if he would like to start the medication in addition to lifestyle management of decreasing saturated fats and sugary/processed foods.

## 2015-07-08 NOTE — Telephone Encounter (Signed)
Spoke with patient regarding results and he would like to think about starting medications. Emphasized importance of lifestyle management with either decision. Patient will call back with a decision.

## 2015-07-10 NOTE — Telephone Encounter (Signed)
Please call patient back in regards  °

## 2015-07-16 NOTE — Telephone Encounter (Signed)
Returned pts call. No answer. LVM for him to call back. 

## 2015-07-16 NOTE — Telephone Encounter (Signed)
Spoke with patient regarding plan. Will try nutritional changes for the next 3-6 months and will retest.

## 2015-07-16 NOTE — Telephone Encounter (Signed)
Pt has called back in regards.

## 2015-07-17 MED FILL — CIALIS 20 MG TABLET: 20 | 30 days supply | Qty: 6 | Fill #2

## 2015-07-17 MED FILL — ANDROGEL 1.62% GEL PUMP: 20.25 MG/AC | 30 days supply | Qty: 75 | Fill #3

## 2015-08-05 DIAGNOSIS — M5032 Other cervical disc degeneration, mid-cervical region, unspecified level: Secondary | ICD-10-CM | POA: Diagnosis not present

## 2015-08-05 DIAGNOSIS — M542 Cervicalgia: Secondary | ICD-10-CM | POA: Diagnosis not present

## 2015-08-05 DIAGNOSIS — M9902 Segmental and somatic dysfunction of thoracic region: Secondary | ICD-10-CM | POA: Diagnosis not present

## 2015-08-05 DIAGNOSIS — S39012A Strain of muscle, fascia and tendon of lower back, initial encounter: Secondary | ICD-10-CM | POA: Diagnosis not present

## 2015-08-05 DIAGNOSIS — M9901 Segmental and somatic dysfunction of cervical region: Secondary | ICD-10-CM | POA: Diagnosis not present

## 2015-08-05 DIAGNOSIS — M50122 Cervical disc disorder at C5-C6 level with radiculopathy: Secondary | ICD-10-CM | POA: Diagnosis not present

## 2015-08-05 DIAGNOSIS — M9903 Segmental and somatic dysfunction of lumbar region: Secondary | ICD-10-CM | POA: Diagnosis not present

## 2015-08-06 DIAGNOSIS — S39012A Strain of muscle, fascia and tendon of lower back, initial encounter: Secondary | ICD-10-CM | POA: Diagnosis not present

## 2015-08-06 DIAGNOSIS — M5032 Other cervical disc degeneration, mid-cervical region, unspecified level: Secondary | ICD-10-CM | POA: Diagnosis not present

## 2015-08-06 DIAGNOSIS — M542 Cervicalgia: Secondary | ICD-10-CM | POA: Diagnosis not present

## 2015-08-06 DIAGNOSIS — M9902 Segmental and somatic dysfunction of thoracic region: Secondary | ICD-10-CM | POA: Diagnosis not present

## 2015-08-06 DIAGNOSIS — M9901 Segmental and somatic dysfunction of cervical region: Secondary | ICD-10-CM | POA: Diagnosis not present

## 2015-08-06 DIAGNOSIS — M9903 Segmental and somatic dysfunction of lumbar region: Secondary | ICD-10-CM | POA: Diagnosis not present

## 2015-08-06 DIAGNOSIS — M50122 Cervical disc disorder at C5-C6 level with radiculopathy: Secondary | ICD-10-CM | POA: Diagnosis not present

## 2015-08-12 DIAGNOSIS — M9902 Segmental and somatic dysfunction of thoracic region: Secondary | ICD-10-CM | POA: Diagnosis not present

## 2015-08-12 DIAGNOSIS — M5032 Other cervical disc degeneration, mid-cervical region, unspecified level: Secondary | ICD-10-CM | POA: Diagnosis not present

## 2015-08-12 DIAGNOSIS — M50122 Cervical disc disorder at C5-C6 level with radiculopathy: Secondary | ICD-10-CM | POA: Diagnosis not present

## 2015-08-12 DIAGNOSIS — M9903 Segmental and somatic dysfunction of lumbar region: Secondary | ICD-10-CM | POA: Diagnosis not present

## 2015-08-12 DIAGNOSIS — M9901 Segmental and somatic dysfunction of cervical region: Secondary | ICD-10-CM | POA: Diagnosis not present

## 2015-08-12 DIAGNOSIS — S39012A Strain of muscle, fascia and tendon of lower back, initial encounter: Secondary | ICD-10-CM | POA: Diagnosis not present

## 2015-08-12 DIAGNOSIS — M542 Cervicalgia: Secondary | ICD-10-CM | POA: Diagnosis not present

## 2015-08-13 DIAGNOSIS — S39012A Strain of muscle, fascia and tendon of lower back, initial encounter: Secondary | ICD-10-CM | POA: Diagnosis not present

## 2015-08-13 DIAGNOSIS — M5032 Other cervical disc degeneration, mid-cervical region, unspecified level: Secondary | ICD-10-CM | POA: Diagnosis not present

## 2015-08-13 DIAGNOSIS — M542 Cervicalgia: Secondary | ICD-10-CM | POA: Diagnosis not present

## 2015-08-13 DIAGNOSIS — M9903 Segmental and somatic dysfunction of lumbar region: Secondary | ICD-10-CM | POA: Diagnosis not present

## 2015-08-13 DIAGNOSIS — M50122 Cervical disc disorder at C5-C6 level with radiculopathy: Secondary | ICD-10-CM | POA: Diagnosis not present

## 2015-08-13 DIAGNOSIS — M9901 Segmental and somatic dysfunction of cervical region: Secondary | ICD-10-CM | POA: Diagnosis not present

## 2015-08-13 DIAGNOSIS — M9902 Segmental and somatic dysfunction of thoracic region: Secondary | ICD-10-CM | POA: Diagnosis not present

## 2015-08-14 DIAGNOSIS — S39012A Strain of muscle, fascia and tendon of lower back, initial encounter: Secondary | ICD-10-CM | POA: Diagnosis not present

## 2015-08-14 DIAGNOSIS — M50122 Cervical disc disorder at C5-C6 level with radiculopathy: Secondary | ICD-10-CM | POA: Diagnosis not present

## 2015-08-14 DIAGNOSIS — M9901 Segmental and somatic dysfunction of cervical region: Secondary | ICD-10-CM | POA: Diagnosis not present

## 2015-08-14 DIAGNOSIS — M542 Cervicalgia: Secondary | ICD-10-CM | POA: Diagnosis not present

## 2015-08-14 DIAGNOSIS — M9903 Segmental and somatic dysfunction of lumbar region: Secondary | ICD-10-CM | POA: Diagnosis not present

## 2015-08-14 DIAGNOSIS — M9902 Segmental and somatic dysfunction of thoracic region: Secondary | ICD-10-CM | POA: Diagnosis not present

## 2015-08-14 DIAGNOSIS — M5032 Other cervical disc degeneration, mid-cervical region, unspecified level: Secondary | ICD-10-CM | POA: Diagnosis not present

## 2015-08-19 DIAGNOSIS — S39012A Strain of muscle, fascia and tendon of lower back, initial encounter: Secondary | ICD-10-CM | POA: Diagnosis not present

## 2015-08-19 DIAGNOSIS — M9903 Segmental and somatic dysfunction of lumbar region: Secondary | ICD-10-CM | POA: Diagnosis not present

## 2015-08-19 DIAGNOSIS — M5032 Other cervical disc degeneration, mid-cervical region, unspecified level: Secondary | ICD-10-CM | POA: Diagnosis not present

## 2015-08-19 DIAGNOSIS — M9901 Segmental and somatic dysfunction of cervical region: Secondary | ICD-10-CM | POA: Diagnosis not present

## 2015-08-19 DIAGNOSIS — M9902 Segmental and somatic dysfunction of thoracic region: Secondary | ICD-10-CM | POA: Diagnosis not present

## 2015-08-19 DIAGNOSIS — M542 Cervicalgia: Secondary | ICD-10-CM | POA: Diagnosis not present

## 2015-08-19 DIAGNOSIS — M50122 Cervical disc disorder at C5-C6 level with radiculopathy: Secondary | ICD-10-CM | POA: Diagnosis not present

## 2015-08-26 DIAGNOSIS — S39012A Strain of muscle, fascia and tendon of lower back, initial encounter: Secondary | ICD-10-CM | POA: Diagnosis not present

## 2015-08-26 DIAGNOSIS — M5032 Other cervical disc degeneration, mid-cervical region, unspecified level: Secondary | ICD-10-CM | POA: Diagnosis not present

## 2015-08-26 DIAGNOSIS — M9903 Segmental and somatic dysfunction of lumbar region: Secondary | ICD-10-CM | POA: Diagnosis not present

## 2015-08-26 DIAGNOSIS — M542 Cervicalgia: Secondary | ICD-10-CM | POA: Diagnosis not present

## 2015-08-26 DIAGNOSIS — M9901 Segmental and somatic dysfunction of cervical region: Secondary | ICD-10-CM | POA: Diagnosis not present

## 2015-08-26 DIAGNOSIS — M9902 Segmental and somatic dysfunction of thoracic region: Secondary | ICD-10-CM | POA: Diagnosis not present

## 2015-08-26 DIAGNOSIS — M50122 Cervical disc disorder at C5-C6 level with radiculopathy: Secondary | ICD-10-CM | POA: Diagnosis not present

## 2015-08-27 DIAGNOSIS — M542 Cervicalgia: Secondary | ICD-10-CM | POA: Diagnosis not present

## 2015-08-27 DIAGNOSIS — M5032 Other cervical disc degeneration, mid-cervical region, unspecified level: Secondary | ICD-10-CM | POA: Diagnosis not present

## 2015-08-27 DIAGNOSIS — M9901 Segmental and somatic dysfunction of cervical region: Secondary | ICD-10-CM | POA: Diagnosis not present

## 2015-08-27 DIAGNOSIS — M50122 Cervical disc disorder at C5-C6 level with radiculopathy: Secondary | ICD-10-CM | POA: Diagnosis not present

## 2015-08-27 DIAGNOSIS — M9903 Segmental and somatic dysfunction of lumbar region: Secondary | ICD-10-CM | POA: Diagnosis not present

## 2015-08-27 DIAGNOSIS — M9902 Segmental and somatic dysfunction of thoracic region: Secondary | ICD-10-CM | POA: Diagnosis not present

## 2015-08-27 DIAGNOSIS — S39012A Strain of muscle, fascia and tendon of lower back, initial encounter: Secondary | ICD-10-CM | POA: Diagnosis not present

## 2015-08-27 MED FILL — ANDROGEL 1.62% GEL PUMP: 20.25 MG/AC | 30 days supply | Qty: 75 | Fill #4

## 2015-08-27 MED FILL — CIALIS 20 MG TABLET: 20 | 30 days supply | Qty: 6 | Fill #3

## 2015-08-28 DIAGNOSIS — S39012A Strain of muscle, fascia and tendon of lower back, initial encounter: Secondary | ICD-10-CM | POA: Diagnosis not present

## 2015-08-28 DIAGNOSIS — M9901 Segmental and somatic dysfunction of cervical region: Secondary | ICD-10-CM | POA: Diagnosis not present

## 2015-08-28 DIAGNOSIS — M5032 Other cervical disc degeneration, mid-cervical region, unspecified level: Secondary | ICD-10-CM | POA: Diagnosis not present

## 2015-08-28 DIAGNOSIS — M9902 Segmental and somatic dysfunction of thoracic region: Secondary | ICD-10-CM | POA: Diagnosis not present

## 2015-08-28 DIAGNOSIS — M9903 Segmental and somatic dysfunction of lumbar region: Secondary | ICD-10-CM | POA: Diagnosis not present

## 2015-08-28 DIAGNOSIS — M50122 Cervical disc disorder at C5-C6 level with radiculopathy: Secondary | ICD-10-CM | POA: Diagnosis not present

## 2015-08-28 DIAGNOSIS — M542 Cervicalgia: Secondary | ICD-10-CM | POA: Diagnosis not present

## 2015-09-02 DIAGNOSIS — M9902 Segmental and somatic dysfunction of thoracic region: Secondary | ICD-10-CM | POA: Diagnosis not present

## 2015-09-02 DIAGNOSIS — M50122 Cervical disc disorder at C5-C6 level with radiculopathy: Secondary | ICD-10-CM | POA: Diagnosis not present

## 2015-09-02 DIAGNOSIS — M542 Cervicalgia: Secondary | ICD-10-CM | POA: Diagnosis not present

## 2015-09-02 DIAGNOSIS — S39012A Strain of muscle, fascia and tendon of lower back, initial encounter: Secondary | ICD-10-CM | POA: Diagnosis not present

## 2015-09-02 DIAGNOSIS — M9903 Segmental and somatic dysfunction of lumbar region: Secondary | ICD-10-CM | POA: Diagnosis not present

## 2015-09-02 DIAGNOSIS — M5032 Other cervical disc degeneration, mid-cervical region, unspecified level: Secondary | ICD-10-CM | POA: Diagnosis not present

## 2015-09-02 DIAGNOSIS — M9901 Segmental and somatic dysfunction of cervical region: Secondary | ICD-10-CM | POA: Diagnosis not present

## 2015-09-09 DIAGNOSIS — M9901 Segmental and somatic dysfunction of cervical region: Secondary | ICD-10-CM | POA: Diagnosis not present

## 2015-09-09 DIAGNOSIS — M50122 Cervical disc disorder at C5-C6 level with radiculopathy: Secondary | ICD-10-CM | POA: Diagnosis not present

## 2015-09-09 DIAGNOSIS — M542 Cervicalgia: Secondary | ICD-10-CM | POA: Diagnosis not present

## 2015-09-09 DIAGNOSIS — M9902 Segmental and somatic dysfunction of thoracic region: Secondary | ICD-10-CM | POA: Diagnosis not present

## 2015-09-09 DIAGNOSIS — M5032 Other cervical disc degeneration, mid-cervical region, unspecified level: Secondary | ICD-10-CM | POA: Diagnosis not present

## 2015-09-09 DIAGNOSIS — S39012A Strain of muscle, fascia and tendon of lower back, initial encounter: Secondary | ICD-10-CM | POA: Diagnosis not present

## 2015-09-09 DIAGNOSIS — M9903 Segmental and somatic dysfunction of lumbar region: Secondary | ICD-10-CM | POA: Diagnosis not present

## 2015-09-11 DIAGNOSIS — M9901 Segmental and somatic dysfunction of cervical region: Secondary | ICD-10-CM | POA: Diagnosis not present

## 2015-09-11 DIAGNOSIS — M5032 Other cervical disc degeneration, mid-cervical region, unspecified level: Secondary | ICD-10-CM | POA: Diagnosis not present

## 2015-09-11 DIAGNOSIS — M542 Cervicalgia: Secondary | ICD-10-CM | POA: Diagnosis not present

## 2015-09-11 DIAGNOSIS — M9903 Segmental and somatic dysfunction of lumbar region: Secondary | ICD-10-CM | POA: Diagnosis not present

## 2015-09-11 DIAGNOSIS — M9902 Segmental and somatic dysfunction of thoracic region: Secondary | ICD-10-CM | POA: Diagnosis not present

## 2015-09-11 DIAGNOSIS — S39012A Strain of muscle, fascia and tendon of lower back, initial encounter: Secondary | ICD-10-CM | POA: Diagnosis not present

## 2015-09-11 DIAGNOSIS — M50122 Cervical disc disorder at C5-C6 level with radiculopathy: Secondary | ICD-10-CM | POA: Diagnosis not present

## 2015-09-23 DIAGNOSIS — M9903 Segmental and somatic dysfunction of lumbar region: Secondary | ICD-10-CM | POA: Diagnosis not present

## 2015-09-23 DIAGNOSIS — M50122 Cervical disc disorder at C5-C6 level with radiculopathy: Secondary | ICD-10-CM | POA: Diagnosis not present

## 2015-09-23 DIAGNOSIS — M542 Cervicalgia: Secondary | ICD-10-CM | POA: Diagnosis not present

## 2015-09-23 DIAGNOSIS — M5032 Other cervical disc degeneration, mid-cervical region, unspecified level: Secondary | ICD-10-CM | POA: Diagnosis not present

## 2015-09-23 DIAGNOSIS — M9902 Segmental and somatic dysfunction of thoracic region: Secondary | ICD-10-CM | POA: Diagnosis not present

## 2015-09-23 DIAGNOSIS — M9901 Segmental and somatic dysfunction of cervical region: Secondary | ICD-10-CM | POA: Diagnosis not present

## 2015-09-23 DIAGNOSIS — S39012A Strain of muscle, fascia and tendon of lower back, initial encounter: Secondary | ICD-10-CM | POA: Diagnosis not present

## 2015-09-24 ENCOUNTER — Other Ambulatory Visit (HOSPITAL_COMMUNITY): Payer: Self-pay | Admitting: Chiropractic Medicine

## 2015-09-24 DIAGNOSIS — M9901 Segmental and somatic dysfunction of cervical region: Secondary | ICD-10-CM

## 2015-09-24 DIAGNOSIS — M50122 Cervical disc disorder at C5-C6 level with radiculopathy: Secondary | ICD-10-CM

## 2015-09-30 MED FILL — MOMETASONE FUROATE 50 MCG S: 50 | 30 days supply | Qty: 17 | Fill #0

## 2015-10-08 ENCOUNTER — Ambulatory Visit (HOSPITAL_COMMUNITY)
Admission: RE | Admit: 2015-10-08 | Discharge: 2015-10-08 | Disposition: A | Discharge status: Home / Self Care | Payer: 59 | Source: Ambulatory Visit | Attending: Chiropractic Medicine | Admitting: Chiropractic Medicine

## 2015-10-08 DIAGNOSIS — R202 Paresthesia of skin: Secondary | ICD-10-CM | POA: Diagnosis not present

## 2015-10-08 DIAGNOSIS — M47812 Spondylosis without myelopathy or radiculopathy, cervical region: Secondary | ICD-10-CM | POA: Diagnosis not present

## 2015-10-08 DIAGNOSIS — M50122 Cervical disc disorder at C5-C6 level with radiculopathy: Secondary | ICD-10-CM | POA: Insufficient documentation

## 2015-10-08 DIAGNOSIS — M9901 Segmental and somatic dysfunction of cervical region: Secondary | ICD-10-CM

## 2015-10-08 DIAGNOSIS — Z01818 Encounter for other preprocedural examination: Secondary | ICD-10-CM | POA: Diagnosis not present

## 2015-10-08 DIAGNOSIS — M5090 Cervical disc disorder, unspecified, unspecified cervical region: Secondary | ICD-10-CM | POA: Insufficient documentation

## 2015-10-08 DIAGNOSIS — M4802 Spinal stenosis, cervical region: Secondary | ICD-10-CM | POA: Insufficient documentation

## 2015-10-08 DIAGNOSIS — Z1389 Encounter for screening for other disorder: Secondary | ICD-10-CM | POA: Insufficient documentation

## 2015-10-14 ENCOUNTER — Encounter: Payer: Self-pay | Admitting: Internal Medicine

## 2015-10-20 ENCOUNTER — Encounter: Payer: Self-pay | Admitting: Internal Medicine

## 2015-10-20 MED FILL — ANDROGEL 1.62% GEL PUMP: 20.25 MG/AC | 30 days supply | Qty: 75 | Fill #0

## 2015-10-23 DIAGNOSIS — M25511 Pain in right shoulder: Secondary | ICD-10-CM | POA: Diagnosis not present

## 2015-10-23 DIAGNOSIS — M25512 Pain in left shoulder: Secondary | ICD-10-CM | POA: Diagnosis not present

## 2015-10-23 DIAGNOSIS — M7541 Impingement syndrome of right shoulder: Secondary | ICD-10-CM | POA: Diagnosis not present

## 2015-10-23 DIAGNOSIS — M7542 Impingement syndrome of left shoulder: Secondary | ICD-10-CM | POA: Diagnosis not present

## 2015-11-03 DIAGNOSIS — M5021 Other cervical disc displacement,  high cervical region: Secondary | ICD-10-CM | POA: Diagnosis not present

## 2015-11-03 DIAGNOSIS — R030 Elevated blood-pressure reading, without diagnosis of hypertension: Secondary | ICD-10-CM | POA: Diagnosis not present

## 2015-11-03 DIAGNOSIS — M542 Cervicalgia: Secondary | ICD-10-CM | POA: Diagnosis not present

## 2015-11-03 MED FILL — MELOXICAM 15 MG TABLET: 15 | 30 days supply | Qty: 30 | Fill #0

## 2015-11-21 MED FILL — MOMETASONE FUROATE 50 MCG S: 50 | 30 days supply | Qty: 17 | Fill #1

## 2015-11-21 MED FILL — CIALIS 20 MG TABLET: 20 | 30 days supply | Qty: 6 | Fill #4

## 2015-11-21 MED FILL — PANTOPRAZOLE SOD DR 40 MG T: 40 | 90 days supply | Qty: 90 | Fill #1

## 2015-11-24 ENCOUNTER — Encounter: Payer: Self-pay | Admitting: Physician Assistant

## 2015-11-24 ENCOUNTER — Telehealth: Payer: Self-pay | Admitting: Internal Medicine

## 2015-11-24 ENCOUNTER — Ambulatory Visit (INDEPENDENT_AMBULATORY_CARE_PROVIDER_SITE_OTHER): Payer: 59 | Admitting: Physician Assistant

## 2015-11-24 ENCOUNTER — Inpatient Hospital Stay (HOSPITAL_COMMUNITY)
Admission: AD | Admit: 2015-11-24 | Discharge: 2015-11-27 | DRG: 379 | Disposition: A | Discharge status: Home / Self Care | Payer: 59 | Source: Ambulatory Visit | Attending: Family Medicine | Admitting: Family Medicine

## 2015-11-24 ENCOUNTER — Encounter (HOSPITAL_COMMUNITY): Payer: Self-pay | Admitting: *Deleted

## 2015-11-24 VITALS — BP 140/70 | HR 86 | Ht 70.0 in | Wt 162.0 lb

## 2015-11-24 DIAGNOSIS — K922 Gastrointestinal hemorrhage, unspecified: Secondary | ICD-10-CM | POA: Diagnosis not present

## 2015-11-24 DIAGNOSIS — K219 Gastro-esophageal reflux disease without esophagitis: Secondary | ICD-10-CM | POA: Diagnosis present

## 2015-11-24 DIAGNOSIS — K644 Residual hemorrhoidal skin tags: Secondary | ICD-10-CM | POA: Diagnosis not present

## 2015-11-24 DIAGNOSIS — Z87891 Personal history of nicotine dependence: Secondary | ICD-10-CM | POA: Diagnosis not present

## 2015-11-24 DIAGNOSIS — Z86010 Personal history of colonic polyps: Secondary | ICD-10-CM

## 2015-11-24 DIAGNOSIS — K5730 Diverticulosis of large intestine without perforation or abscess without bleeding: Secondary | ICD-10-CM | POA: Diagnosis not present

## 2015-11-24 DIAGNOSIS — K5731 Diverticulosis of large intestine without perforation or abscess with bleeding: Secondary | ICD-10-CM | POA: Diagnosis not present

## 2015-11-24 DIAGNOSIS — Z79899 Other long term (current) drug therapy: Secondary | ICD-10-CM | POA: Diagnosis not present

## 2015-11-24 DIAGNOSIS — K648 Other hemorrhoids: Secondary | ICD-10-CM | POA: Diagnosis not present

## 2015-11-24 DIAGNOSIS — D62 Acute posthemorrhagic anemia: Secondary | ICD-10-CM | POA: Diagnosis present

## 2015-11-24 DIAGNOSIS — Z7982 Long term (current) use of aspirin: Secondary | ICD-10-CM

## 2015-11-24 DIAGNOSIS — K921 Melena: Secondary | ICD-10-CM | POA: Diagnosis not present

## 2015-11-24 DIAGNOSIS — I10 Essential (primary) hypertension: Secondary | ICD-10-CM | POA: Diagnosis not present

## 2015-11-24 DIAGNOSIS — E291 Testicular hypofunction: Secondary | ICD-10-CM | POA: Diagnosis present

## 2015-11-24 LAB — COMPREHENSIVE METABOLIC PANEL
ALBUMIN: 4.4 g/dL (ref 3.5–5.0)
ALT: 25 U/L (ref 17–63)
AST: 23 U/L (ref 15–41)
Alkaline Phosphatase: 37 U/L — ABNORMAL LOW (ref 38–126)
Anion gap: 8 (ref 5–15)
BILIRUBIN TOTAL: 0.6 mg/dL (ref 0.3–1.2)
BUN: 15 mg/dL (ref 6–20)
CHLORIDE: 105 mmol/L (ref 101–111)
CO2: 29 mmol/L (ref 22–32)
CREATININE: 0.94 mg/dL (ref 0.61–1.24)
Calcium: 9.6 mg/dL (ref 8.9–10.3)
GFR calc Af Amer: >60 mL/min (ref >60)
GLUCOSE: 99 mg/dL (ref 65–99)
POTASSIUM: 3.8 mmol/L (ref 3.5–5.1)
Sodium: 142 mmol/L (ref 135–145)
TOTAL PROTEIN: 7.4 g/dL (ref 6.5–8.1)

## 2015-11-24 LAB — CBC WITH DIFFERENTIAL/PLATELET
Basophils Absolute: 0 10*3/uL (ref 0.0–0.1)
Basophils Relative: 1 %
EOS ABS: 0.1 10*3/uL (ref 0.0–0.7)
EOS PCT: 1 %
HCT: 39.9 % (ref 39.0–52.0)
Hemoglobin: 13.7 g/dL (ref 13.0–17.0)
LYMPHS ABS: 0.9 10*3/uL (ref 0.7–4.0)
LYMPHS PCT: 16 %
MCH: 29.8 pg (ref 26.0–34.0)
MCHC: 34.3 g/dL (ref 30.0–36.0)
MCV: 86.9 fL (ref 78.0–100.0)
MONO ABS: 0.3 10*3/uL (ref 0.1–1.0)
Monocytes Relative: 6 %
Neutro Abs: 4.1 10*3/uL (ref 1.7–7.7)
Neutrophils Relative %: 76 %
PLATELETS: 220 10*3/uL (ref 150–400)
RBC: 4.59 MIL/uL (ref 4.22–5.81)
RDW: 12.6 % (ref 11.5–15.5)
WBC: 5.4 10*3/uL (ref 4.0–10.5)

## 2015-11-24 LAB — TYPE AND SCREEN
ABO/RH(D): A POS
Antibody Screen: NEGATIVE

## 2015-11-24 LAB — ABO/RH: ABO/RH(D): A POS

## 2015-11-24 LAB — HEMOGLOBIN AND HEMATOCRIT, BLOOD
HEMATOCRIT: 35.2 % — AB (ref 39.0–52.0)
Hemoglobin: 12.3 g/dL — ABNORMAL LOW (ref 13.0–17.0)

## 2015-11-24 LAB — VITAMIN B12: Vitamin B-12: 256 pg/mL (ref 180–914)

## 2015-11-24 LAB — MAGNESIUM: MAGNESIUM: 1.6 mg/dL — AB (ref 1.7–2.4)

## 2015-11-24 MED ORDER — ONDANSETRON HCL 4 MG PO TABS: 4.0000 mg | ORAL_TABLET | Freq: Four times a day (QID) | ORAL | Status: DC | PRN

## 2015-11-24 MED ORDER — MAGNESIUM SULFATE 2 GM/50ML IV SOLN
2.0000 g | Freq: Once | INTRAVENOUS | Status: AC
  Administered 2015-11-24: 2 g via INTRAVENOUS
  Filled 2015-11-24: qty 50

## 2015-11-24 MED ORDER — ONDANSETRON HCL 4 MG/2ML IJ SOLN: 4.0000 mg | Freq: Four times a day (QID) | INTRAMUSCULAR | Status: DC | PRN

## 2015-11-24 MED ORDER — PANTOPRAZOLE SODIUM 40 MG PO TBEC: 40.0000 mg | DELAYED_RELEASE_TABLET | Freq: Two times a day (BID) | ORAL | Status: DC

## 2015-11-24 MED ORDER — PANTOPRAZOLE SODIUM 40 MG IV SOLR
40.0000 mg | Freq: Two times a day (BID) | INTRAVENOUS | Status: DC
  Administered 2015-11-25 – 2015-11-26 (×3): 40 mg via INTRAVENOUS
  Filled 2015-11-24 (×4): qty 40

## 2015-11-24 MED ORDER — PANTOPRAZOLE SODIUM 40 MG IV SOLR
40.0000 mg | INTRAVENOUS | Status: AC
  Administered 2015-11-24: 40 mg via INTRAVENOUS
  Filled 2015-11-24: qty 40

## 2015-11-24 MED ORDER — SODIUM CHLORIDE 0.9 % IV SOLN
INTRAVENOUS | Status: DC
  Administered 2015-11-24 – 2015-11-26 (×5): via INTRAVENOUS

## 2015-11-24 MED ORDER — PEG-KCL-NACL-NASULF-NA ASC-C 100 G PO SOLR
1.0000 | Freq: Once | ORAL | Status: AC
  Administered 2015-11-25: 200 g via ORAL
  Filled 2015-11-24: qty 1

## 2015-11-24 NOTE — Telephone Encounter (Signed)
Wife reports patient has had a few episodes of black stools and ? Maroon stools.  He is not passing blood independent of stool.  His wife reports that he is at work and asymptomatic.  He has been on meloxicam for about a month.  He will come in today and see Ellouise Newer, [place] at 1:15

## 2015-11-24 NOTE — Consult Note (Signed)
Please see GI note by PA Lemmon for full history of and physical from GI service. I agree with assessment and plan as outlined by the patient. Relatively painless episodes of rectal bleeding, maroon colored stool. Hgb stable at this time. Suspect this may be diverticular in etiology although has recent NSAID use. Agree with EGD and colonoscopy, to be done tomorrow. I discussed risks / benefits of endoscopy with him and he wished to proceed. Will trend CBC otherwise and see if bleeding persists throughout prep.   Hampton Bays Cellar, MD [facility] Pager [phone]
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
## 2015-11-25 ENCOUNTER — Encounter (HOSPITAL_COMMUNITY)
Admission: AD | Disposition: A | Discharge status: Home / Self Care | Payer: Self-pay | Source: Ambulatory Visit | Attending: Internal Medicine

## 2015-11-25 ENCOUNTER — Observation Stay (HOSPITAL_COMMUNITY): Payer: 59 | Admitting: Anesthesiology

## 2015-11-25 ENCOUNTER — Observation Stay (HOSPITAL_COMMUNITY): Payer: 59

## 2015-11-25 ENCOUNTER — Encounter (HOSPITAL_COMMUNITY): Payer: Self-pay | Admitting: Certified Registered"

## 2015-11-25 DIAGNOSIS — Z87891 Personal history of nicotine dependence: Secondary | ICD-10-CM | POA: Diagnosis not present

## 2015-11-25 DIAGNOSIS — I10 Essential (primary) hypertension: Secondary | ICD-10-CM | POA: Diagnosis present

## 2015-11-25 DIAGNOSIS — Z79899 Other long term (current) drug therapy: Secondary | ICD-10-CM | POA: Diagnosis not present

## 2015-11-25 DIAGNOSIS — K921 Melena: Secondary | ICD-10-CM | POA: Diagnosis not present

## 2015-11-25 DIAGNOSIS — K219 Gastro-esophageal reflux disease without esophagitis: Secondary | ICD-10-CM | POA: Diagnosis not present

## 2015-11-25 DIAGNOSIS — K922 Gastrointestinal hemorrhage, unspecified: Secondary | ICD-10-CM | POA: Diagnosis not present

## 2015-11-25 DIAGNOSIS — K644 Residual hemorrhoidal skin tags: Secondary | ICD-10-CM | POA: Diagnosis present

## 2015-11-25 DIAGNOSIS — Z7982 Long term (current) use of aspirin: Secondary | ICD-10-CM | POA: Diagnosis not present

## 2015-11-25 DIAGNOSIS — K5730 Diverticulosis of large intestine without perforation or abscess without bleeding: Secondary | ICD-10-CM

## 2015-11-25 DIAGNOSIS — K5731 Diverticulosis of large intestine without perforation or abscess with bleeding: Secondary | ICD-10-CM | POA: Diagnosis present

## 2015-11-25 DIAGNOSIS — K5791 Diverticulosis of intestine, part unspecified, without perforation or abscess with bleeding: Secondary | ICD-10-CM | POA: Diagnosis not present

## 2015-11-25 DIAGNOSIS — K648 Other hemorrhoids: Secondary | ICD-10-CM | POA: Diagnosis present

## 2015-11-25 DIAGNOSIS — K449 Diaphragmatic hernia without obstruction or gangrene: Secondary | ICD-10-CM | POA: Diagnosis not present

## 2015-11-25 HISTORY — PX: IR GENERIC HISTORICAL: IMG1180011

## 2015-11-25 HISTORY — PX: ESOPHAGOGASTRODUODENOSCOPY (EGD) WITH PROPOFOL: SHX5813

## 2015-11-25 HISTORY — DX: Gastrointestinal hemorrhage, unspecified: K92.2

## 2015-11-25 HISTORY — PX: COLONOSCOPY WITH PROPOFOL: SHX5780

## 2015-11-25 LAB — CBC
HEMATOCRIT: 37.6 % — AB (ref 39.0–52.0)
HEMOGLOBIN: 12.9 g/dL — AB (ref 13.0–17.0)
MCH: 30.1 pg (ref 26.0–34.0)
MCHC: 34.3 g/dL (ref 30.0–36.0)
MCV: 87.9 fL (ref 78.0–100.0)
Platelets: 203 10*3/uL (ref 150–400)
RBC: 4.28 MIL/uL (ref 4.22–5.81)
RDW: 12.7 % (ref 11.5–15.5)
WBC: 4.4 10*3/uL (ref 4.0–10.5)

## 2015-11-25 LAB — BASIC METABOLIC PANEL
Anion gap: 4 — ABNORMAL LOW (ref 5–15)
BUN: 11 mg/dL (ref 6–20)
CHLORIDE: 108 mmol/L (ref 101–111)
CO2: 26 mmol/L (ref 22–32)
CREATININE: 0.94 mg/dL (ref 0.61–1.24)
Calcium: 8.6 mg/dL — ABNORMAL LOW (ref 8.9–10.3)
GFR calc Af Amer: >60 mL/min (ref >60)
GFR calc non Af Amer: >60 mL/min (ref >60)
GLUCOSE: 103 mg/dL — AB (ref 65–99)
Potassium: 4 mmol/L (ref 3.5–5.1)
Sodium: 138 mmol/L (ref 135–145)

## 2015-11-25 LAB — HEMOGLOBIN AND HEMATOCRIT, BLOOD
HCT: 37.3 % — ABNORMAL LOW (ref 39.0–52.0)
HEMATOCRIT: 34.2 % — AB (ref 39.0–52.0)
Hemoglobin: 13.1 g/dL (ref 13.0–17.0)
Hemoglobin: 11.8 g/dL — ABNORMAL LOW (ref 13.0–17.0)

## 2015-11-25 SURGERY — ESOPHAGOGASTRODUODENOSCOPY (EGD) WITH PROPOFOL
Anesthesia: Monitor Anesthesia Care

## 2015-11-25 MED ORDER — LACTATED RINGERS IV SOLN
INTRAVENOUS | Status: DC
  Administered 2015-11-25: 13:00:00 via INTRAVENOUS

## 2015-11-25 MED ORDER — SODIUM CHLORIDE 0.9 % IV SOLN
INTRAVENOUS | Status: AC | PRN
  Administered 2015-11-25: 10 mL/h via INTRAVENOUS

## 2015-11-25 MED ORDER — LIDOCAINE 2% (20 MG/ML) 5 ML SYRINGE
INTRAMUSCULAR | Status: DC | PRN
Start: 2015-11-25 — End: 2015-11-25
  Administered 2015-11-25: 50 mg via INTRAVENOUS

## 2015-11-25 MED ORDER — TECHNETIUM TC 99M-LABELED RED BLOOD CELLS IV KIT
23.1000 | PACK | Freq: Once | INTRAVENOUS | Status: AC | PRN
  Administered 2015-11-25: 23.1 via INTRAVENOUS

## 2015-11-25 MED ORDER — PROPOFOL 500 MG/50ML IV EMUL
INTRAVENOUS | Status: DC | PRN
  Administered 2015-11-25: 150 ug/kg/min via INTRAVENOUS

## 2015-11-25 MED ORDER — IOPAMIDOL (ISOVUE-300) INJECTION 61%
100.0000 mL | Freq: Once | INTRAVENOUS | Status: AC | PRN
  Administered 2015-11-25: 75 mL via INTRA_ARTERIAL

## 2015-11-25 MED ORDER — FENTANYL CITRATE (PF) 100 MCG/2ML IJ SOLN
INTRAMUSCULAR | Status: AC | PRN
  Administered 2015-11-25: 50 ug via INTRAVENOUS

## 2015-11-25 MED ORDER — MIDAZOLAM HCL 2 MG/2ML IJ SOLN
INTRAMUSCULAR | Status: AC | PRN
  Administered 2015-11-25: 1 mg via INTRAVENOUS

## 2015-11-25 MED ORDER — LIDOCAINE HCL 1 % IJ SOLN
INTRAMUSCULAR | Status: AC
  Filled 2015-11-25: qty 20

## 2015-11-25 MED ORDER — IOPAMIDOL (ISOVUE-300) INJECTION 61%
100.0000 mL | Freq: Once | INTRAVENOUS | Status: AC | PRN
  Administered 2015-11-25: 30 mL via INTRA_ARTERIAL

## 2015-11-25 MED ORDER — PROPOFOL 10 MG/ML IV BOLUS
INTRAVENOUS | Status: DC | PRN
  Administered 2015-11-25: 30 mg via INTRAVENOUS
  Administered 2015-11-25: 70 mg via INTRAVENOUS

## 2015-11-25 MED FILL — GABAPENTIN 300 MG CAPSULE: 300 | 30 days supply | Qty: 90 | Fill #0

## 2015-11-25 SURGICAL SUPPLY — 24 items

## 2015-11-25 NOTE — Progress Notes (Signed)
PROGRESS NOTE  John Johnston [id number] DOB: Jan 30, 1956 DOA: 11/24/2015 PCP: Mauricio Po, FNP   LOS: 1 day   Brief Narrative: 60 y.o. male  without any significant past medical history other than reflux, prior history of esophageal strictures requiring dilatation, history of degenerative disc disease and a C-spine for which she sees Dr. Joya Salm, presents to the emergency room with chief complaints of GI bleed.  Assessment & Plan: Active Problems:   GERD (gastroesophageal reflux disease)   GI bleeding   GI bleed   Blood in stool   GI bleeding - Hb slightly lower, still seeing blood, now more red - GI to scope today   DVT prophylaxis: SCD Code Status: Full Family Communication: d/w wife Pam Disposition Plan: home when ready   Consultants:   GI  Procedures:   EGD/Colon pending  Antimicrobials:  none   Subjective: - no chest pain, shortness of breath, no abdominal pain, nausea or vomiting.   Objective: Vitals:   11/24/15 1442 11/24/15 2156 11/25/15 0546  BP: (!) 156/96 (!) 167/83 (!) 168/83  Pulse: 72 63 61  Resp: 20 18 18   Temp: 98.2 F (36.8 C) 98.5 F (36.9 C) 98.2 F (36.8 C)  TempSrc: Oral Oral Oral  SpO2: 100% 97% 100%    Intake/Output Summary (Last 24 hours) at 11/25/15 1118 Last data filed at 11/25/15 0835  Gross per 24 hour  Intake          1053.75 ml  Output                0 ml  Net          1053.75 ml   There were no vitals filed for this visit.  Examination: Constitutional: NAD Vitals:   11/24/15 1442 11/24/15 2156 11/25/15 0546  BP: (!) 156/96 (!) 167/83 (!) 168/83  Pulse: 72 63 61  Resp: 20 18 18   Temp: 98.2 F (36.8 C) 98.5 F (36.9 C) 98.2 F (36.8 C)  TempSrc: Oral Oral Oral  SpO2: 100% 97% 100%   Respiratory: clear to auscultation bilaterally, no wheezing, no crackles. Normal respiratory effort. No accessory muscle use.  Cardiovascular: Regular rate and rhythm, no murmurs / rubs / gallops. No LE edema. 2+ pedal  pulses. No carotid bruits.  Abdomen: no tenderness. Bowel sounds positive.     Data Reviewed: I have personally reviewed following labs and imaging studies  CBC:  Recent Labs Lab 11/24/15 1531 11/24/15 2203 11/25/15 0414 11/25/15 0910  WBC 5.4  --  4.4  --   NEUTROABS 4.1  --   --   --   HGB 13.7 12.3* 12.9* 13.1  HCT 39.9 35.2* 37.6* 37.3*  MCV 86.9  --  87.9  --   PLT 220  --  203  --    Basic Metabolic Panel:  Recent Labs Lab 11/24/15 1531 11/25/15 0414  NA 142 138  K 3.8 4.0  CL 105 108  CO2 29 26  GLUCOSE 99 103*  BUN 15 11  CREATININE 0.94 0.94  CALCIUM 9.6 8.6*  MG 1.6*  --    GFR: Estimated Creatinine Clearance: 88.4 mL/min (by C-G formula based on SCr of 0.94 mg/dL). Liver Function Tests:  Recent Labs Lab 11/24/15 1531  AST 23  ALT 25  ALKPHOS 37*  BILITOT 0.6  PROT 7.4  ALBUMIN 4.4   No results for input(s): LIPASE, AMYLASE in the last 168 hours. No results for input(s): AMMONIA in the last 168 hours. Coagulation Profile: No  results for input(s): INR, PROTIME in the last 168 hours. Cardiac Enzymes: No results for input(s): CKTOTAL, CKMB, CKMBINDEX, TROPONINI in the last 168 hours. BNP (last 3 results) No results for input(s): PROBNP in the last 8760 hours. HbA1C: No results for input(s): HGBA1C in the last 72 hours. CBG: No results for input(s): GLUCAP in the last 168 hours. Lipid Profile: No results for input(s): CHOL, HDL, LDLCALC, TRIG, CHOLHDL, LDLDIRECT in the last 72 hours. Thyroid Function Tests: No results for input(s): TSH, T4TOTAL, FREET4, T3FREE, THYROIDAB in the last 72 hours. Anemia Panel:  Recent Labs  11/24/15 1531  VITAMINB12 256   Urine analysis: No results found for: COLORURINE, APPEARANCEUR, LABSPEC, PHURINE, GLUCOSEU, HGBUR, BILIRUBINUR, KETONESUR, PROTEINUR, UROBILINOGEN, NITRITE, LEUKOCYTESUR Sepsis Labs: Invalid input(s): PROCALCITONIN, LACTICIDVEN  No results found for this or any previous visit (from  the past 240 hour(s)).    Radiology Studies: No results found.   Scheduled Meds: . pantoprazole (PROTONIX) IV  40 mg Intravenous Q12H   Continuous Infusions: . sodium chloride 75 mL/hr at 11/25/15 [id number]    Marzetta Board, MD, PhD [facility] Pager [phone]  If 7PM-7AM, please contact night-coverage [email or web address] Password [email or web address] 11/25/2015, 11:18 AM

## 2015-11-25 NOTE — Sedation Documentation (Signed)
WILL TRANSPORT PT TO 5E 1514-1.AAOX4. PT IN NO APPARENT DISTRESS OR PAIN. PT GIVEN INSTRUCTIONS NOT TO MOVE THE RIGHT LEG X6HRS POST PROCEDURE. THE OPPORTUNITY TO ASK QUESTIONS WAS PROVIDED.

## 2015-11-25 NOTE — Anesthesia Preprocedure Evaluation (Addendum)
Anesthesia Evaluation  Patient identified by MRN, date of birth, ID band Patient awake    Reviewed: Allergy & Precautions, NPO status , Patient's Chart, lab work & pertinent test results  Airway Mallampati: II  TM Distance: >3 FB Neck ROM: Full    Dental   Pulmonary former smoker,    breath sounds clear to auscultation       Cardiovascular negative cardio ROS   Rhythm:Regular Rate:Normal     Neuro/Psych negative neurological ROS     GI/Hepatic Neg liver ROS, GERD  ,  Endo/Other  negative endocrine ROS  Renal/GU negative Renal ROS     Musculoskeletal  (+) Arthritis ,   Abdominal   Peds  Hematology negative hematology ROS (+)   Anesthesia Other Findings   Reproductive/Obstetrics                            Anesthesia Physical Anesthesia Plan  ASA: II  Anesthesia Plan: MAC   Post-op Pain Management:    Induction: Intravenous  Airway Management Planned: Natural Airway and Nasal Cannula  Additional Equipment:   Intra-op Plan:   Post-operative Plan:   Informed Consent: I have reviewed the patients History and Physical, chart, labs and discussed the procedure including the risks, benefits and alternatives for the proposed anesthesia with the patient or authorized representative who has indicated his/her understanding and acceptance.     Plan Discussed with: CRNA  Anesthesia Plan Comments:         Anesthesia Quick Evaluation

## 2015-11-25 NOTE — Op Note (Signed)
[facility] Patient Name: John Johnston Procedure Date: 11/25/2015 MRN: [id number] Attending MD: Carlota Raspberry. Havery Moros , MD Date of Birth: 1955/07/26 CSN: [id number] Age: 60 Admit Type: Inpatient Procedure:                Colonoscopy Indications:              Evaluation of unexplained GI bleeding, negative EGD Providers:                Carlota Raspberry. Havery Moros, MD, Cleda Daub, RN, Ralene Bathe, Technician, [facility], CRNA Referring MD:              Medicines:                Monitored Anesthesia Care Complications:            No immediate complications. Estimated blood loss:                            None. Estimated Blood Loss:     Estimated blood loss: none. Procedure:                Pre-Anesthesia Assessment:                           - Prior to the procedure, a History and Physical                            was performed, and patient medications and                            allergies were reviewed. The patient's tolerance of                            previous anesthesia was also reviewed. The risks                            and benefits of the procedure and the sedation                            options and risks were discussed with the patient.                            All questions were answered, and informed consent                            was obtained. Prior Anticoagulants: The patient has                            taken aspirin, last dose was 1 day prior to                            procedure. ASA Grade Assessment: III - A patient  with severe systemic disease. After reviewing the                            risks and benefits, the patient was deemed in                            satisfactory condition to undergo the procedure.                           After obtaining informed consent, the colonoscope                            was passed under direct vision. Throughout the            procedure, the patient's blood pressure, pulse, and                            oxygen saturations were monitored continuously. The                            [id number] scope was introduced through                            the anus and advanced to the the terminal ileum,                            with identification of the appendiceal orifice and                            IC valve. The colonoscopy was performed without                            difficulty. The patient tolerated the procedure                            well. The quality of the bowel preparation was                            fair. The terminal ileum, ileocecal valve,                            appendiceal orifice, and rectum were photographed. Scope In: 2:15:00 PM Scope Out: 2:44:24 PM Scope Withdrawal Time: 0 hours 25 minutes 58 seconds  Total Procedure Duration: 0 hours 29 minutes 24 seconds  Findings:      The perianal exam findings include non-thrombosed external hemorrhoids.      Scattered medium-mouthed diverticula were found in the transverse colon,       ascending colon and cecum. These were lavaged and no active bleeding       appreciated.      Red blood was found in the entire colon with small clots.      The terminal ileum was deeply intubated and contained red blood, but no       clear source was identified.      Non-bleeding internal hemorrhoids were found during retroflexion. The  hemorrhoids were moderate.      The exam was otherwise without abnormality. The bowel prep was only fair       due to blood and small clots noted. A few small polyps were noted in the       transverse and left colon, not removed today given active bleeding. Impression:               - Preparation of the colon was fair due to blood                            and clots.                           - Non-thrombosed external hemorrhoids found on                            perianal exam.                            - Diverticulosis in the transverse colon, in the                            ascending colon and in the cecum.                           - Blood in the entire examined colon.                           - Blood in the terminal ileum.                           - Non-bleeding internal hemorrhoids.                           Overall, given the amount of blood noted in the                            ileum, I suspect small bowel bleeding is most                            likely, although right sided diverticular bleed                            with reflux of blood into the ileum is also                            possible. Moderate Sedation:      No moderate sedation, case performed with MAC Recommendation:           - Return patient to hospital ward for ongoing care.                           - NPO.                           - Continue present medications                           -  Tagged RBC scan now to help localize bleeding                            source                           - GI service will continue to follow the patient                           - Repeat colonoscopy once outpatient for                            polypectomy because the bowel preparation was                            suboptimal and small polyps noted on this exam. Procedure Code(s):        --- Professional ---                           [phone], Colonoscopy, flexible; diagnostic, including                            collection of specimen(s) by brushing or washing,                            when performed (separate procedure) Diagnosis Code(s):        --- Professional ---                           K64.4, Residual hemorrhoidal skin tags                           K64.8, Other hemorrhoids                           K92.2, Gastrointestinal hemorrhage, unspecified                           K57.30, Diverticulosis of large intestine without                            perforation or abscess without bleeding CPT  copyright 2016 American Medical Association. All rights reserved. The codes documented in this report are preliminary and upon coder review may  be revised to meet current compliance requirements. Remo Lipps P. Havery Moros, MD 11/25/2015 2:56:28 PM This report has been signed electronically. Number of Addenda: 0

## 2015-11-25 NOTE — Op Note (Signed)
[facility] Patient Name: John Johnston Procedure Date: 11/25/2015 MRN: [id number] Attending MD: Carlota Raspberry. Havery Moros , MD Date of Birth: [birth year] CSN: [id number] Age: 60 Admit Type: Inpatient Procedure:                Upper GI endoscopy Indications:              Gastrointestinal bleeding of unknown origin Providers:                Carlota Raspberry. Havery Moros, MD, Cleda Daub, RN, Ralene Bathe, Technician, [facility], CRNA Referring MD:              Medicines:                Monitored Anesthesia Care Complications:            No immediate complications. Estimated blood loss:                            None. Estimated Blood Loss:     Estimated blood loss: none. Procedure:                Pre-Anesthesia Assessment:                           - Prior to the procedure, a History and Physical                            was performed, and patient medications and                            allergies were reviewed. The patient's tolerance of                            previous anesthesia was also reviewed. The risks                            and benefits of the procedure and the sedation                            options and risks were discussed with the patient.                            All questions were answered, and informed consent                            was obtained. Prior Anticoagulants: The patient has                            taken aspirin, last dose was 1 day prior to                            procedure. ASA Grade Assessment: III - A patient  with severe systemic disease. After reviewing the                            risks and benefits, the patient was deemed in                            satisfactory condition to undergo the procedure.                           After obtaining informed consent, the endoscope was                            passed under direct vision. Throughout the         procedure, the patient's blood pressure, pulse, and                            oxygen saturations were monitored continuously. The                            [id number]) scope was introduced through the                            mouth, and advanced to the second part of duodenum.                            The upper GI endoscopy was accomplished without                            difficulty. The patient tolerated the procedure                            well. Scope In: Scope Out: Findings:      Esophagogastric landmarks were identified: the Z-line was found at 38       cm, the gastroesophageal junction was found at 38 cm and the upper       extent of the gastric folds was found at 40 cm from the incisors.      A 2 cm hiatal hernia was present.      The exam of the esophagus was otherwise normal.      The entire examined stomach was normal.      The duodenal bulb and second portion of the duodenum were normal. Impression:               - Esophagogastric landmarks identified.                           - 2 cm hiatal hernia.                           - Normal esophagus otherwise                           - Normal stomach.                           - Normal  duodenal bulb and second portion of the                            duodenum.                           - No blood noted, no evidence of pathology to cause                            the patient's symptoms Moderate Sedation:      No moderate sedation, case performed with MAC Recommendation:           - Return patient to hospital ward for ongoing care.                           - NPO.                           - Continue present medications                           - Recommendations per Colonoscopy report Procedure Code(s):        --- Professional ---                           [phone], Esophagogastroduodenoscopy, flexible,                            transoral; diagnostic, including collection of                             specimen(s) by brushing or washing, when performed                            (separate procedure) Diagnosis Code(s):        --- Professional ---                           K92.2, Gastrointestinal hemorrhage, unspecified CPT copyright 2016 American Medical Association. All rights reserved. The codes documented in this report are preliminary and upon coder review may  be revised to meet current compliance requirements. Remo Lipps P. Gurinder Toral, MD 11/25/2015 2:59:36 PM This report has been signed electronically. Number of Addenda: 0

## 2015-11-25 NOTE — Procedures (Signed)
S/p celiac and SMA angiograms  Neg for acute bleed  No comp Stable  Full report in pacs

## 2015-11-25 NOTE — Progress Notes (Signed)
Patient ID: John Johnston, male   DOB: September 12, 1955, 60 y.o.   MRN: [id number]    Progress Note   Subjective   Feels fine- completing prep - stool is liquid reddish this am , no melena HGB 12.9      Objective   Vital signs in last 24 hours: Temp:  [98.2 F (36.8 C)-98.5 F (36.9 C)] 98.2 F (36.8 C) (09/12 0546) Pulse Rate:  [61-86] 61 (09/12 0546) Resp:  [18-20] 18 (09/12 0546) BP: (140-168)/(70-96) 168/83 (09/12 0546) SpO2:  [97 %-100 %] 100 % (09/12 0546) Weight:  [162 lb (73.5 kg)] 162 lb (73.5 kg) (09/11 1332) Last BM Date: 11/25/15 General:    white male  in NAD Heart:  Regular rate and rhythm; no murmurs Lungs: Respirations even and unlabored, lungs CTA bilaterally Abdomen:  Soft, nontender and nondistended. Normal bowel sounds. Extremities:  Without edema. Neurologic:  Alert and oriented,  grossly normal neurologically. Psych:  Cooperative. Normal mood and affect.  Intake/Output from previous day: 09/11 0701 - 09/12 0700 In: 1053.8 [I.V.:1053.8] Out: -  Intake/Output this shift: No intake/output data recorded.  Lab Results:  Recent Labs  11/24/15 1531 11/24/15 2203 11/25/15 0414  WBC 5.4  --  4.4  HGB 13.7 12.3* 12.9*  HCT 39.9 35.2* 37.6*  PLT 220  --  203   BMET  Recent Labs  11/24/15 1531 11/25/15 0414  NA 142 138  K 3.8 4.0  CL 105 108  CO2 29 26  GLUCOSE 99 103*  BUN 15 11  CREATININE 0.94 0.94  CALCIUM 9.6 8.6*   LFT  Recent Labs  11/24/15 1531  PROT 7.4  ALBUMIN 4.4  AST 23  ALT 25  ALKPHOS 37*  BILITOT 0.6   PT/INR No results for input(s): LABPROT, INR in the last 72 hours.      Assessment / Plan:    #1 60 yo Wm with acute GI bleed -suspect lower , possible  diverticular . Pt has been stable. HGB has dropped but still in normal range  #2 Hx of adenomatous polyps and diverticulosis-last Colon 2012 Plan is for Colon /EGD this afternoon with Dr Havery Moros Further recommendations pending findings  Active Problems:   GERD  (gastroesophageal reflux disease)   GI bleeding   GI bleed     LOS: 1 day   Vivianne Carles  11/25/2015, 9:21 AM

## 2015-11-25 NOTE — H&P (View-Only) (Signed)
Patient ID: John Johnston, male   DOB: 01-12-1956, 60 y.o.   MRN: [id number]    Progress Note   Subjective   Feels fine- completing prep - stool is liquid reddish this am , no melena HGB 12.9      Objective   Vital signs in last 24 hours: Temp:  [98.2 F (36.8 C)-98.5 F (36.9 C)] 98.2 F (36.8 C) (09/12 0546) Pulse Rate:  [61-86] 61 (09/12 0546) Resp:  [18-20] 18 (09/12 0546) BP: (140-168)/(70-96) 168/83 (09/12 0546) SpO2:  [97 %-100 %] 100 % (09/12 0546) Weight:  [162 lb (73.5 kg)] 162 lb (73.5 kg) (09/11 1332) Last BM Date: 11/25/15 General:    white male  in NAD Heart:  Regular rate and rhythm; no murmurs Lungs: Respirations even and unlabored, lungs CTA bilaterally Abdomen:  Soft, nontender and nondistended. Normal bowel sounds. Extremities:  Without edema. Neurologic:  Alert and oriented,  grossly normal neurologically. Psych:  Cooperative. Normal mood and affect.  Intake/Output from previous day: 09/11 0701 - 09/12 0700 In: 1053.8 [I.V.:1053.8] Out: -  Intake/Output this shift: No intake/output data recorded.  Lab Results:  Recent Labs  11/24/15 1531 11/24/15 2203 11/25/15 0414  WBC 5.4  --  4.4  HGB 13.7 12.3* 12.9*  HCT 39.9 35.2* 37.6*  PLT 220  --  203   BMET  Recent Labs  11/24/15 1531 11/25/15 0414  NA 142 138  K 3.8 4.0  CL 105 108  CO2 29 26  GLUCOSE 99 103*  BUN 15 11  CREATININE 0.94 0.94  CALCIUM 9.6 8.6*   LFT  Recent Labs  11/24/15 1531  PROT 7.4  ALBUMIN 4.4  AST 23  ALT 25  ALKPHOS 37*  BILITOT 0.6   PT/INR No results for input(s): LABPROT, INR in the last 72 hours.      Assessment / Plan:    #1 60 yo Wm with acute GI bleed -suspect lower , possible  diverticular . Pt has been stable. HGB has dropped but still in normal range  #2 Hx of adenomatous polyps and diverticulosis-last Colon 2012 Plan is for Colon /EGD this afternoon with Dr Havery Moros Further recommendations pending findings  Active Problems:   GERD  (gastroesophageal reflux disease)   GI bleeding   GI bleed     LOS: 1 day   [name]  11/25/2015, 9:21 AM

## 2015-11-25 NOTE — Progress Notes (Signed)
Agree with Ms. Mort Sawyers management of this man with GI bleeding. Gatha Mayer, MD, Marval Regal

## 2015-11-25 NOTE — Interval H&P Note (Signed)
History and Physical Interval Note:  11/25/2015 1:56 PM  John Johnston  has presented today for surgery, with the diagnosis of GI bleed  The various methods of treatment have been discussed with the patient and family. After consideration of risks, benefits and other options for treatment, the patient has consented to  Procedure(s): ESOPHAGOGASTRODUODENOSCOPY (EGD) WITH PROPOFOL (N/A) COLONOSCOPY WITH PROPOFOL (N/A) as a surgical intervention .  The patient's history has been reviewed, patient examined, no change in status, stable for surgery.  I have reviewed the patient's chart and labs.  Questions were answered to the patient's satisfaction.     Renelda Loma Garnetta Fedrick

## 2015-11-25 NOTE — Transfer of Care (Signed)
Immediate Anesthesia Transfer of Care Note  Patient: RENAE KORBER  Procedure(s) Performed: Procedure(s): ESOPHAGOGASTRODUODENOSCOPY (EGD) WITH PROPOFOL (N/A) COLONOSCOPY WITH PROPOFOL (N/A)  Patient Location: PACU and Endoscopy Unit  Anesthesia Type:MAC  Level of Consciousness: sedated and patient cooperative  Airway & Oxygen Therapy: Patient Spontanous Breathing and Patient connected to nasal cannula oxygen  Post-op Assessment: Report given to RN and Post -op Vital signs reviewed and stable  Post vital signs: Reviewed and stable  Last Vitals:  Vitals:   11/25/15 0546 11/25/15 1257  BP: (!) 168/83 (!) 177/78  Pulse: 61 66  Resp: 18 11  Temp: 36.8 C 36.9 C    Last Pain:  Vitals:   11/25/15 1257  TempSrc: Oral  PainSc:       Patients Stated Pain Goal: 0 ([id number])  Complications: No apparent anesthesia complications

## 2015-11-25 NOTE — Anesthesia Postprocedure Evaluation (Signed)
Anesthesia Post Note  Patient: John Johnston  Procedure(s) Performed: Procedure(s) (LRB): ESOPHAGOGASTRODUODENOSCOPY (EGD) WITH PROPOFOL (N/A) COLONOSCOPY WITH PROPOFOL (N/A)  Patient location during evaluation: PACU Anesthesia Type: MAC Level of consciousness: awake and alert Pain management: pain level controlled Vital Signs Assessment: post-procedure vital signs reviewed and stable Respiratory status: spontaneous breathing, nonlabored ventilation, respiratory function stable and patient connected to nasal cannula oxygen Cardiovascular status: stable and blood pressure returned to baseline Anesthetic complications: no    Last Vitals:  Vitals:   11/25/15 1450 11/25/15 1500  BP: 120/73 (!) 145/58  Pulse: (!) 56 (!) 57  Resp: 11 15  Temp: 36.4 C     Last Pain:  Vitals:   11/25/15 1450  TempSrc: Oral  PainSc:                  John Johnston

## 2015-11-26 ENCOUNTER — Encounter (HOSPITAL_COMMUNITY): Payer: Self-pay | Admitting: Interventional Radiology

## 2015-11-26 DIAGNOSIS — K219 Gastro-esophageal reflux disease without esophagitis: Secondary | ICD-10-CM

## 2015-11-26 LAB — HEMOGLOBIN AND HEMATOCRIT, BLOOD
HCT: 36.9 % — ABNORMAL LOW (ref 39.0–52.0)
HEMATOCRIT: 33.5 % — AB (ref 39.0–52.0)
HEMATOCRIT: 34.1 % — AB (ref 39.0–52.0)
HEMOGLOBIN: 11.9 g/dL — AB (ref 13.0–17.0)
Hemoglobin: 11.7 g/dL — ABNORMAL LOW (ref 13.0–17.0)
Hemoglobin: 12.9 g/dL — ABNORMAL LOW (ref 13.0–17.0)

## 2015-11-26 LAB — HOMOCYSTEINE: HOMOCYSTEINE-NORM: 10.3 umol/L (ref 0.0–15.0)

## 2015-11-26 MED ORDER — ACETAMINOPHEN 325 MG PO TABS
650.0000 mg | ORAL_TABLET | ORAL | Status: DC | PRN
  Administered 2015-11-26: 650 mg via ORAL
  Filled 2015-11-26: qty 2

## 2015-11-26 MED ORDER — DICLOFENAC SODIUM 1 % TD GEL
4.0000 g | Freq: Four times a day (QID) | TRANSDERMAL | Status: DC | PRN
  Administered 2015-11-26: 4 g via TOPICAL
  Filled 2015-11-26: qty 100

## 2015-11-26 MED ORDER — PANTOPRAZOLE SODIUM 40 MG PO TBEC
40.0000 mg | DELAYED_RELEASE_TABLET | Freq: Every day | ORAL | Status: DC
  Administered 2015-11-26 – 2015-11-27 (×2): 40 mg via ORAL
  Filled 2015-11-26 (×2): qty 1

## 2015-11-26 NOTE — Progress Notes (Signed)
Patient ID: John Johnston, male   DOB: 1955/04/03, 60 y.o.   MRN: [id number]    Progress Note   Subjective    Bleeding scan was positive last pm - angio negative HGB 11.7 this am - stable  Overnight Pt had one BM maroon/dark about 5 pm last night then no further stools until this am - had one maroonish /dark stool a couple hours ago.   Objective   Vital signs in last 24 hours: Temp:  [97.5 F (36.4 C)-98.4 F (36.9 C)] 98.3 F (36.8 C) (09/13 0447) Pulse Rate:  [56-78] 61 (09/13 0447) Resp:  [11-18] 18 (09/13 0447) BP: (120-177)/(58-91) 149/77 (09/13 0447) SpO2:  [96 %-100 %] 100 % (09/13 0447) Weight:  [162 lb (73.5 kg)] 162 lb (73.5 kg) (09/12 1257) Last BM Date: 11/25/15 General:    white male in NAD Heart:  Regular rate and rhythm; no murmurs Lungs: Respirations even and unlabored, lungs CTA bilaterally Abdomen:  Soft, nontender and nondistended. Normal bowel sounds. Extremities:  Without edema. Neurologic:  Alert and oriented,  grossly normal neurologically. Psych:  Cooperative. Normal mood and affect.  Intake/Output from previous day: 09/12 0701 - 09/13 0700 In: 700 [I.V.:700] Out: -  Intake/Output this shift: No intake/output data recorded.  Lab Results:  Recent Labs  11/24/15 1531  11/25/15 0414 11/25/15 0910 11/25/15 2148 11/26/15 0546  WBC 5.4  --  4.4  --   --   --   HGB 13.7  < > 12.9* 13.1 11.8* 11.7*  HCT 39.9  < > 37.6* 37.3* 34.2* 33.5*  PLT 220  --  203  --   --   --   < > = values in this interval not displayed. BMET  Recent Labs  11/24/15 1531 11/25/15 0414  NA 142 138  K 3.8 4.0  CL 105 108  CO2 29 26  GLUCOSE 99 103*  BUN 15 11  CREATININE 0.94 0.94  CALCIUM 9.6 8.6*   LFT  Recent Labs  11/24/15 1531  PROT 7.4  ALBUMIN 4.4  AST 23  ALT 25  ALKPHOS 37*  BILITOT 0.6   PT/INR No results for input(s): LABPROT, INR in the last 72 hours.  Studies/Results: Nm Gi Blood Loss  Result Date: 11/25/2015 CLINICAL DATA:  Concern  for lower GI bleed. EXAM: NUCLEAR MEDICINE GASTROINTESTINAL BLEEDING SCAN TECHNIQUE: Sequential abdominal images were obtained following intravenous administration of Tc-57m labeled red blood cells. RADIOPHARMACEUTICALS:  23.1 mCi Tc-33m in-vitro labeled red cells. COMPARISON:  None. FINDINGS: There is abnormal activity originating over the expected location of the hepatic flexure of the colon. This activity is noted to transit along the expected course of the transverse colon. Physiologic activity is seen within the spleen, liver and within the major abdominal vasculature. IMPRESSION: Scintigraphic findings worrisome for acute lower GI bleed with source of bleed suspected within the hepatic flexure of the colon. Above findings were discussed with Dr. Annamaria Boots, the on call interventional radiologist, at the time of procedure completion. Electronically Signed   By: Sandi Mariscal M.D.   On: 11/25/2015 18:13       Assessment / Plan:    #1 60 yo WM with acute lower GI bleed - pt has right sided diverticuli found at colonoscopy yesterday , unable to identify discrete bleeding site. Bleeding scan + in right colon, and angio was done which was  Negative. He has presumably bled from right sided diverticulum Hgb stable overnight Has passed some maroonish stool this am -time will  tell if this is old blood vs recurrent hemorrhage   Start full liquids Continue q 6 hours hgbs If manifests recurrent active bleed  Will needs repeat bleeding scan and angio for hemostasis Discussed at length with pt and wife  Active Problems:   GERD (gastroesophageal reflux disease)   GI bleeding   GI bleed   Blood in stool     LOS: 2 days   [name]  11/26/2015, 10:25 AM

## 2015-11-26 NOTE — Progress Notes (Signed)
PROGRESS NOTE  John Johnston [id number] DOB: January 29, 1956 DOA: 11/24/2015 PCP: Mauricio Po, FNP   LOS: 2 days   Brief Narrative: 60 y.o. male  without any significant past medical history other than reflux, prior history of esophageal strictures requiring dilatation, history of degenerative disc disease and a C-spine for which she sees Dr. Joya Salm, presents to the emergency room with chief complaints of GI bleed.  Assessment & Plan: Active Problems:   GERD (gastroesophageal reflux disease)   GI bleeding   GI bleed   Blood in stool  Acute lower GI bleeding - Pt has right sided diverticulosis - bleeding scan was positive right colon, angio negative - Hb remained stable - GI advancing to full liquid diet  GERD - stable on PPI therapy  Elevated blood pressures - following, if continue to remain elevated, consider starting tx for essential hypertension. Reduce IVF today.   DVT prophylaxis: SCD Code Status: Full  Family Communication: d/w wife Pam Disposition Plan: home when ready   Consultants:   GI  Procedures:   EGD/Colon pending  Antimicrobials:  none   Subjective: Pt did have a blood stool this morning   Objective: Vitals:   11/25/15 2113 11/25/15 2120 11/25/15 2143 11/26/15 0447  BP: (!) 163/84 (!) 159/78 127/84 (!) 149/77  Pulse: 67 65 68 61  Resp: 18 14 16 18   Temp:   98.4 F (36.9 C) 98.3 F (36.8 C)  TempSrc:   Oral Oral  SpO2: 97% 96% 98% 100%  Weight:      Height:        Intake/Output Summary (Last 24 hours) at 11/26/15 1233 Last data filed at 11/25/15 2121  Gross per 24 hour  Intake              700 ml  Output                0 ml  Net              700 ml   Filed Weights   11/25/15 1257  Weight: 73.5 kg (162 lb)    Examination: Constitutional: NAD Vitals:   11/25/15 2113 11/25/15 2120 11/25/15 2143 11/26/15 0447  BP: (!) 163/84 (!) 159/78 127/84 (!) 149/77  Pulse: 67 65 68 61  Resp: 18 14 16 18   Temp:   98.4 F (36.9 C) 98.3  F (36.8 C)  TempSrc:   Oral Oral  SpO2: 97% 96% 98% 100%  Weight:      Height:       General: cooperative pleasant  NAD Heart:  Regular rate and rhythm; no murmurs Lungs: Respirations even and unlabored, lungs CTA bilaterally Abdomen:  Soft, nontender and nondistended. Normal bowel sounds. Extremities:  Without edema. Neurologic:  Alert and oriented,  grossly normal neurologically. Psych:  Cooperative. Normal mood and affect.  Data Reviewed: I have personally reviewed following labs and imaging studies  CBC:  Recent Labs Lab 11/24/15 1531  11/25/15 0414 11/25/15 0910 11/25/15 2148 11/26/15 0546 11/26/15 1200  WBC 5.4  --  4.4  --   --   --   --   NEUTROABS 4.1  --   --   --   --   --   --   HGB 13.7  < > 12.9* 13.1 11.8* 11.7* 12.9*  HCT 39.9  < > 37.6* 37.3* 34.2* 33.5* 36.9*  MCV 86.9  --  87.9  --   --   --   --   PLT 220  --  203  --   --   --   --   < > = values in this interval not displayed. Basic Metabolic Panel:  Recent Labs Lab 11/24/15 1531 11/25/15 0414  NA 142 138  K 3.8 4.0  CL 105 108  CO2 29 26  GLUCOSE 99 103*  BUN 15 11  CREATININE 0.94 0.94  CALCIUM 9.6 8.6*  MG 1.6*  --    GFR: Estimated Creatinine Clearance: 88.4 mL/min (by C-G formula based on SCr of 0.94 mg/dL). Liver Function Tests:  Recent Labs Lab 11/24/15 1531  AST 23  ALT 25  ALKPHOS 37*  BILITOT 0.6  PROT 7.4  ALBUMIN 4.4   No results for input(s): LIPASE, AMYLASE in the last 168 hours. No results for input(s): AMMONIA in the last 168 hours. Coagulation Profile: No results for input(s): INR, PROTIME in the last 168 hours. Cardiac Enzymes: No results for input(s): CKTOTAL, CKMB, CKMBINDEX, TROPONINI in the last 168 hours. BNP (last 3 results) No results for input(s): PROBNP in the last 8760 hours. HbA1C: No results for input(s): HGBA1C in the last 72 hours. CBG: No results for input(s): GLUCAP in the last 168 hours. Lipid Profile: No results for input(s): CHOL,  HDL, LDLCALC, TRIG, CHOLHDL, LDLDIRECT in the last 72 hours. Thyroid Function Tests: No results for input(s): TSH, T4TOTAL, FREET4, T3FREE, THYROIDAB in the last 72 hours. Anemia Panel:  Recent Labs  11/24/15 1531  VITAMINB12 256   Urine analysis: No results found for: COLORURINE, APPEARANCEUR, LABSPEC, PHURINE, GLUCOSEU, HGBUR, BILIRUBINUR, KETONESUR, PROTEINUR, UROBILINOGEN, NITRITE, LEUKOCYTESUR Sepsis Labs: Invalid input(s): PROCALCITONIN, LACTICIDVEN  No results found for this or any previous visit (from the past 240 hour(s)).    Radiology Studies: Nm Gi Blood Loss  Result Date: 11/25/2015 CLINICAL DATA:  Concern for lower GI bleed. EXAM: NUCLEAR MEDICINE GASTROINTESTINAL BLEEDING SCAN TECHNIQUE: Sequential abdominal images were obtained following intravenous administration of Tc-74m labeled red blood cells. RADIOPHARMACEUTICALS:  23.1 mCi Tc-55m in-vitro labeled red cells. COMPARISON:  None. FINDINGS: There is abnormal activity originating over the expected location of the hepatic flexure of the colon. This activity is noted to transit along the expected course of the transverse colon. Physiologic activity is seen within the spleen, liver and within the major abdominal vasculature. IMPRESSION: Scintigraphic findings worrisome for acute lower GI bleed with source of bleed suspected within the hepatic flexure of the colon. Above findings were discussed with Dr. Annamaria Boots, the on call interventional radiologist, at the time of procedure completion. Electronically Signed   By: Sandi Mariscal M.D.   On: 11/25/2015 18:13   Scheduled Meds: . pantoprazole  40 mg Oral Q0600   Continuous Infusions: . sodium chloride 75 mL/hr at 11/26/15 0501   C. Wynetta Emery, MD [facility] Pager [phone]  If 7PM-7AM, please contact night-coverage [email or web address] Password [facility] 11/26/2015, 12:33 PM

## 2015-11-27 ENCOUNTER — Other Ambulatory Visit: Payer: Self-pay

## 2015-11-27 DIAGNOSIS — K5731 Diverticulosis of large intestine without perforation or abscess with bleeding: Secondary | ICD-10-CM | POA: Diagnosis present

## 2015-11-27 DIAGNOSIS — D62 Acute posthemorrhagic anemia: Secondary | ICD-10-CM | POA: Diagnosis present

## 2015-11-27 DIAGNOSIS — K921 Melena: Secondary | ICD-10-CM

## 2015-11-27 DIAGNOSIS — E291 Testicular hypofunction: Secondary | ICD-10-CM | POA: Diagnosis present

## 2015-11-27 HISTORY — DX: Diverticulosis of large intestine without perforation or abscess with bleeding: K57.31

## 2015-11-27 LAB — CBC WITH DIFFERENTIAL/PLATELET
BASOS ABS: 0 10*3/uL (ref 0.0–0.1)
Basophils Relative: 1 %
EOS ABS: 0.1 10*3/uL (ref 0.0–0.7)
EOS PCT: 3 %
HCT: 33 % — ABNORMAL LOW (ref 39.0–52.0)
Hemoglobin: 11.5 g/dL — ABNORMAL LOW (ref 13.0–17.0)
LYMPHS PCT: 26 %
Lymphs Abs: 1.1 10*3/uL (ref 0.7–4.0)
MCH: 30.4 pg (ref 26.0–34.0)
MCHC: 34.8 g/dL (ref 30.0–36.0)
MCV: 87.3 fL (ref 78.0–100.0)
Monocytes Absolute: 0.3 10*3/uL (ref 0.1–1.0)
Monocytes Relative: 8 %
Neutro Abs: 2.6 10*3/uL (ref 1.7–7.7)
Neutrophils Relative %: 62 %
PLATELETS: 200 10*3/uL (ref 150–400)
RBC: 3.78 MIL/uL — ABNORMAL LOW (ref 4.22–5.81)
RDW: 12.6 % (ref 11.5–15.5)
WBC: 4.1 10*3/uL (ref 4.0–10.5)

## 2015-11-27 LAB — HEMOGLOBIN AND HEMATOCRIT, BLOOD
HCT: 36.5 % — ABNORMAL LOW (ref 39.0–52.0)
HCT: 36.7 % — ABNORMAL LOW (ref 39.0–52.0)
HEMATOCRIT: 32.7 % — AB (ref 39.0–52.0)
Hemoglobin: 11.3 g/dL — ABNORMAL LOW (ref 13.0–17.0)
Hemoglobin: 12.6 g/dL — ABNORMAL LOW (ref 13.0–17.0)
Hemoglobin: 12.6 g/dL — ABNORMAL LOW (ref 13.0–17.0)

## 2015-11-27 LAB — BASIC METABOLIC PANEL
Anion gap: 5 (ref 5–15)
BUN: 8 mg/dL (ref 6–20)
CALCIUM: 8.8 mg/dL — AB (ref 8.9–10.3)
CO2: 27 mmol/L (ref 22–32)
Chloride: 109 mmol/L (ref 101–111)
Creatinine, Ser: 0.87 mg/dL (ref 0.61–1.24)
GFR calc Af Amer: >60 mL/min (ref >60)
GLUCOSE: 97 mg/dL (ref 65–99)
Potassium: 3.7 mmol/L (ref 3.5–5.1)
SODIUM: 141 mmol/L (ref 135–145)

## 2015-11-27 NOTE — Discharge Instructions (Signed)
Return or seek medical care if symptoms come back or new problem develops  Gastrointestinal Bleeding Gastrointestinal bleeding is bleeding somewhere along the path that food travels through the body (digestive tract). This path is anywhere between the mouth and the opening of the butt (anus). You may have blood in your throw up (vomit) or in your poop (stools). If there is a lot of bleeding, you may need to stay in the hospital. [employer]  Only take medicine as told by your doctor.  Eat foods with fiber such as whole grains, fruits, and vegetables. You can also try eating 1 to 3 prunes a day.  Drink enough fluids to keep your pee (urine) clear or pale yellow. GET HELP RIGHT AWAY IF:   Your bleeding gets worse.  You feel dizzy, weak, or you pass out (faint).  You have bad cramps in your back or belly (abdomen).  You have large blood clumps (clots) in your poop.  Your problems are getting worse. MAKE SURE YOU:   Understand these instructions.  Will watch your condition.  Will get help right away if you are not doing well or get worse.   This information is not intended to replace advice given to you by your health care provider. Make sure you discuss any questions you have with your health care provider.   Document Released: 12/09/2007 Document Revised: 02/16/2012 Document Reviewed: 08/19/2014 Elsevier Interactive Patient Education 2016 [employer]  Gastrointestinal Bleeding Scan A gastrointestinal bleeding scan is a procedure used to locate sites of bleeding in the bowel or abdomen. You may need this scan if you have symptoms of gastrointestinal bleeding, such as blood in your stool (feces). The scan may also be used before a surgery done to correct bleeding in these areas. It helps the surgeon find the location of bleeding. In this procedure, a small amount of a radioactive material (tracer) is injected into your blood. A scanner with a camera that detects the radioactive  tracer is used to see if any of the material gets into your abdomen or bowel. If it does, this indicates the site of bleeding. LET [facility] CARE PROVIDER KNOW ABOUT:   Any allergies you have.  All medicines you are taking, including vitamins, herbs, eye drops, creams, and over-the-counter medicines.  Any blood disorders you have.  Previous surgeries you have had.  Medical conditions you have.  If you are pregnant or think you may be pregnant.  If you are breastfeeding. RISKS AND COMPLICATIONS  Generally, this is a safe procedure. However, problems may occur, including:  Exposure to radiation (small amount).  Allergic reaction to the radioactive material. This is rare. BEFORE THE PROCEDURE   Ask your health care provider about changing or stopping your regular medicines. This is especially important if you are taking diabetes medicines or blood thinners. PROCEDURE   An IV tube will be inserted into one of your veins.  A small amount of radioactive tracer will be injected through the tube. In some cases, some of your blood may be drawn and mixed with the radioactive tracer before it is injected through the tube.  As the radioactive tracer travels through your bloodstream, images of your abdominal area will be taken. These images will be taken at short intervals of 5-15 minutes during the procedure. If the images show radioactive tracer in the abdomen or bowel, this indicates the site of bleeding.  Additional images may be taken every hour for up to 24 hours after the injection  of the radioactive tracer. This may be needed to help identify a site of slow bleeding or bleeding that comes and goes. The procedure may vary among health care providers and hospitals. AFTER THE PROCEDURE  Return to your normal activities and diet as directed by your health care provider.  The radioactive tracer will leave your body over the next few days. Drink enough fluid to keep your urine clear or  pale yellow. This will help flush the tracer out of your body.  It is your responsibility to obtain your test results. Ask your health care provider or the department performing the test when and how you will get your results.   This information is not intended to replace advice given to you by your health care provider. Make sure you discuss any questions you have with your health care provider.   Document Released: 04/20/2005 Document Revised: 03/22/2014 Document Reviewed: 12/11/2013 Elsevier Interactive Patient Education 2016 [employer].  Diverticulosis Diverticulosis is the condition that develops when small pouches (diverticula) form in the wall of your colon. Your colon, or large intestine, is where water is absorbed and stool is formed. The pouches form when the inside layer of your colon pushes through weak spots in the outer layers of your colon. CAUSES  No one knows exactly what causes diverticulosis. RISK FACTORS  Being older than 61. Your risk for this condition increases with age. Diverticulosis is rare in people younger than 40 years. By age 69, almost everyone has it.  Eating a low-fiber diet.  Being frequently constipated.  Being overweight.  Not getting enough exercise.  Smoking.  Taking over-the-counter pain medicines, like aspirin and ibuprofen. SYMPTOMS  Most people with diverticulosis do not have symptoms. DIAGNOSIS  Because diverticulosis often has no symptoms, health care providers often discover the condition during an exam for other colon problems. In many cases, a health care provider will diagnose diverticulosis while using a flexible scope to examine the colon (colonoscopy). TREATMENT  If you have never developed an infection related to diverticulosis, you may not need treatment. If you have had an infection before, treatment may include:  Eating more fruits, vegetables, and grains.  Taking a fiber supplement.  Taking a live bacteria supplement  (probiotic).  Taking medicine to relax your colon. HOME CARE INSTRUCTIONS   Drink at least 6-8 glasses of water each day to prevent constipation.  Try not to strain when you have a bowel movement.  Keep all follow-up appointments. If you have had an infection before:  Increase the fiber in your diet as directed by your health care provider or dietitian.  Take a dietary fiber supplement if your health care provider approves.  Only take medicines as directed by your health care provider. SEEK MEDICAL CARE IF:   You have abdominal pain.  You have bloating.  You have cramps.  You have not gone to the bathroom in 3 days. SEEK IMMEDIATE MEDICAL CARE IF:   Your pain gets worse.  Yourbloating becomes very bad.  You have a fever or chills, and your symptoms suddenly get worse.  You begin vomiting.  You have bowel movements that are bloody or black. MAKE SURE YOU:  Understand these instructions.  Will watch your condition.  Will get help right away if you are not doing well or get worse.   This information is not intended to replace advice given to you by your health care provider. Make sure you discuss any questions you have with your health care provider.  Document Released: 11/27/2003 Document Revised: 03/06/2013 Document Reviewed: 01/24/2013 Elsevier Interactive Patient Education 2016 Elsevier Inc.   Colon Polyps Polyps are lumps of extra tissue growing inside the body. Polyps can grow in the large intestine (colon). Most colon polyps are noncancerous (benign). However, some colon polyps can become cancerous over time. Polyps that are larger than a pea may be harmful. To be safe, caregivers remove and test all polyps. CAUSES  Polyps form when mutations in the genes cause your cells to grow and divide even though no more tissue is needed. RISK FACTORS There are a number of risk factors that can increase your chances of getting colon polyps. They include:  Being  older than 50 years.  Family history of colon polyps or colon cancer.  Long-term colon diseases, such as colitis or Crohn disease.  Being overweight.  Smoking.  Being inactive.  Drinking too much alcohol. SYMPTOMS  Most small polyps do not cause symptoms. If symptoms are present, they may include:  Blood in the stool. The stool may look dark red or black.  Constipation or diarrhea that lasts longer than 1 week. DIAGNOSIS People often do not know they have polyps until their caregiver finds them during a regular checkup. Your caregiver can use 4 tests to check for polyps:  Digital rectal exam. The caregiver wears gloves and feels inside the rectum. This test would find polyps only in the rectum.  Barium enema. The caregiver puts a liquid called barium into your rectum before taking X-rays of your colon. Barium makes your colon look white. Polyps are dark, so they are easy to see in the X-ray pictures.  Sigmoidoscopy. A thin, flexible tube (sigmoidoscope) is placed into your rectum. The sigmoidoscope has a light and tiny camera in it. The caregiver uses the sigmoidoscope to look at the last third of your colon.  Colonoscopy. This test is like sigmoidoscopy, but the caregiver looks at the entire colon. This is the most common method for finding and removing polyps. TREATMENT  Any polyps will be removed during a sigmoidoscopy or colonoscopy. The polyps are then tested for cancer. PREVENTION  To help lower your risk of getting more colon polyps:  Eat plenty of fruits and vegetables. Avoid eating fatty foods.  Do not smoke.  Avoid drinking alcohol.  Exercise every day.  Lose weight if recommended by your caregiver.  Eat plenty of calcium and folate. Foods that are rich in calcium include milk, cheese, and broccoli. Foods that are rich in folate include chickpeas, kidney beans, and spinach. HOME CARE INSTRUCTIONS Keep all follow-up appointments as directed by your caregiver.  You may need periodic exams to check for polyps. SEEK MEDICAL CARE IF: You notice bleeding during a bowel movement.   This information is not intended to replace advice given to you by your health care provider. Make sure you discuss any questions you have with your health care provider.   Document Released: 11/26/2003 Document Revised: 03/22/2014 Document Reviewed: 05/11/2011 Elsevier Interactive Patient Education [employer].

## 2015-11-27 NOTE — Discharge Summary (Signed)
Physician Discharge Summary  John Johnston [id number] DOB: 08-31-1955 DOA: 11/24/2015  PCP: John Po, FNP  Admit date: 11/24/2015 Discharge date: 11/27/2015  Admitted From: Home Disposition:  Home  Recommendations for Outpatient Follow-up:  1. Follow up with PCP in 1 weeks 2. Please follow up with GI labs for hemoglobin test on Monday with GI as already arranged 3. Please follow up with Dr. Carlean Johnston to scheduled colonoscopy with polypectomy 4. Please recheck blood pressure and need to start blood pressure medication  Discharge Condition: Stable  CODE STATUS: FULL  Brief/Interim Summary: HPI: John Johnston is a 60 y.o. male  without any significant past medical history other than reflux, prior history of esophageal strictures requiring dilatation, history of degenerative disc disease and a C-spine for which she sees Dr. Joya Johnston, presents to the emergency room with chief complaints of GI bleed. His wife John Johnston is a nurse in ICU, at bedside, and tells me that [name] has been having about 5 dark/maroon bowel movements since last night. He recently saw his neurosurgeon for his degenerative disc disease in spine, and was placed on Mobic. He also has been taking over-the-counter Advil, however rarely, last one he took 3 days ago. He is taking a baby aspirin every day, and he is also on a PPI at home. His never had anything like this in the past. She denies any chest pain, denies any shortness of breath, he denies any lightheadedness or dizziness. He has been having some abdominal discomfort, not really describing it as pain but more like "needing to go to the bathroom". Over the last 3-4 days he's also experienced mild nausea in the morning without vomiting. He has no fever or chills. He was seen today in gastroenterology office, and was direct admitted for further evaluation of his GI bleeding.   Acute lower GI bleeding - Pt has right sided diverticulosis - bleeding scan was positive right colon,  angio negative - Hb remained stable - GI advanced to regular diet.  Pt to follow up with Dr. Carlean Johnston to have repeat colonoscopy and polypectomy as scheduled.  - Avoid aspirin and NSAIDS for now.  Final hemoglobin was 12.6 prior to discharge.  Pt to follow up with GI on Monday for repeat Hg.   GERD - stable on PPI therapy  Elevated blood pressures / ? Hypertension - He had several elevated blood pressures noted while in hospital, recommend having BP recheck outpatient with PCP and treatment started if continues to be elevated.     Discharge Diagnoses:  Active Problems:   GERD (gastroesophageal reflux disease)   Bleeding gastrointestinal   Lower GI bleed   Blood in stool  Discharge Instructions      Medication List    STOP taking these medications   aspirin EC 81 MG tablet   meloxicam 15 MG tablet Commonly known as:  MOBIC   naproxen sodium 220 MG tablet Commonly known as:  ANAPROX     TAKE these medications   ANDROGEL PUMP 20.25 MG/ACT (1.62%) Gel Generic drug:  Testosterone Apply 1 application topically daily.   mometasone 50 MCG/ACT nasal spray Commonly known as:  NASONEX Place 2 sprays into the nose daily.   pantoprazole 40 MG tablet Commonly known as:  PROTONIX Take 40 mg by mouth daily.   tadalafil 20 MG tablet Commonly known as:  CIALIS Take 10 mg by mouth daily as needed for erectile dysfunction.      Follow-up Information    John Po, FNP. Schedule  an appointment as soon as possible for a visit in 1 week(s).   Specialty:  Family Medicine Why:  Hospital Follow Up  Contact information: [place] [address] [phone]        John Rusk, MD. Schedule an appointment as soon as possible for a visit in 1 month(s).   Specialty:  Gastroenterology Why:  Hospital Follow Up and Arrange Colonscopy Contact information: [address] [place] [address] [phone]        GI LAB. Go in 4 day(s).   Why:  go for  hemoglobin check on Monday as arranged by GI         No Known Allergies  Procedures/Studies: Nm Gi Blood Loss  Result Date: 11/25/2015 CLINICAL DATA:  Concern for lower GI bleed. EXAM: NUCLEAR MEDICINE GASTROINTESTINAL BLEEDING SCAN TECHNIQUE: Sequential abdominal images were obtained following intravenous administration of Tc-37m labeled red blood cells. RADIOPHARMACEUTICALS:  23.1 mCi Tc-62m in-vitro labeled red cells. COMPARISON:  None. FINDINGS: There is abnormal activity originating over the expected location of the hepatic flexure of the colon. This activity is noted to transit along the expected course of the transverse colon. Physiologic activity is seen within the spleen, liver and within the major abdominal vasculature. IMPRESSION: Scintigraphic findings worrisome for acute lower GI bleed with source of bleed suspected within the hepatic flexure of the colon. Above findings were discussed with Dr. Annamaria Boots, the on call interventional radiologist, at the time of procedure completion. Electronically Signed   By: Sandi Mariscal M.D.   On: 11/25/2015 18:13   Ir Angiogram Visceral Selective  Result Date: 11/26/2015 INDICATION: Acute lower GI bleeding by nuclear medicine bleeding scan, originating in the hepatic flexure of the colon. This is concerning for a diverticular bleed. EXAM: SELECTIVE VISCERAL ARTERIOGRAPHY; IR ULTRASOUND GUIDANCE VASC ACCESS RIGHT MEDICATIONS: None. ANESTHESIA/SEDATION: Moderate (conscious) sedation was employed during this procedure. A total of Versed 1.0 mg and Fentanyl 50 mcg was administered intravenously. Moderate Sedation Time: 19 minutes. The patient's level of consciousness and vital signs were monitored continuously by radiology nursing throughout the procedure under my direct supervision. CONTRAST:  105 cc Isovue-300 FLUOROSCOPY TIME:  Fluoroscopy Time: 3 minutes 18 seconds (370 mGy). COMPLICATIONS: None immediate. PROCEDURE: Informed consent was obtained from the  patient following explanation of the procedure, risks, benefits and alternatives. The patient understands, agrees and consents for the procedure. All questions were addressed. A time out was performed prior to the initiation of the procedure. Maximal barrier sterile technique utilized including caps, mask, sterile gowns, sterile gloves, large sterile drape, hand hygiene, and Betadine prep. Under sterile conditions and local anesthesia, ultrasound micropuncture access performed of the patent right common femoral artery. Images obtained for documentation. Over guidewire 5 French sheath was inserted. C2 catheter was utilized to select the celiac origin. Selective celiac angiogram performed. Celiac angiogram: Celiac origin is widely patent. Splenic, left gastric, hepatic and gastroduodenal arteries are all patent. No evidence of active bleeding demonstrated. Portal venous phase demonstrates patent of the portal vein. C2 catheter was utilized to select the SMA origin and advanced into the SMA trunk over a Bentson guidewire. Selective SMA angiograms performed. SMA angiogram: SMA is widely patent. No ostial stenosis. Jejunal and colic branches are patent throughout the mesentery. No evidence of active contrast extravasation or bleeding. No other abnormal vascularity demonstrated. On the venous phase, the mesenteric veins and portal vein are patent. The C2 catheter and sheath removed. Hemostasis obtained with manual compression. No immediate complication. Patient tolerated  the procedure well. IMPRESSION: Negative celiac and SMA angiograms for acute or active GI bleeding. Electronically Signed   By: Jerilynn Mages.  Shick M.D.   On: 11/26/2015 14:46   Ir Angiogram Visceral Selective  Result Date: 11/26/2015 INDICATION: Acute lower GI bleeding by nuclear medicine bleeding scan, originating in the hepatic flexure of the colon. This is concerning for a diverticular bleed. EXAM: SELECTIVE VISCERAL ARTERIOGRAPHY; IR ULTRASOUND GUIDANCE  VASC ACCESS RIGHT MEDICATIONS: None. ANESTHESIA/SEDATION: Moderate (conscious) sedation was employed during this procedure. A total of Versed 1.0 mg and Fentanyl 50 mcg was administered intravenously. Moderate Sedation Time: 19 minutes. The patient's level of consciousness and vital signs were monitored continuously by radiology nursing throughout the procedure under my direct supervision. CONTRAST:  105 cc Isovue-300 FLUOROSCOPY TIME:  Fluoroscopy Time: 3 minutes 18 seconds (370 mGy). COMPLICATIONS: None immediate. PROCEDURE: Informed consent was obtained from the patient following explanation of the procedure, risks, benefits and alternatives. The patient understands, agrees and consents for the procedure. All questions were addressed. A time out was performed prior to the initiation of the procedure. Maximal barrier sterile technique utilized including caps, mask, sterile gowns, sterile gloves, large sterile drape, hand hygiene, and Betadine prep. Under sterile conditions and local anesthesia, ultrasound micropuncture access performed of the patent right common femoral artery. Images obtained for documentation. Over guidewire 5 French sheath was inserted. C2 catheter was utilized to select the celiac origin. Selective celiac angiogram performed. Celiac angiogram: Celiac origin is widely patent. Splenic, left gastric, hepatic and gastroduodenal arteries are all patent. No evidence of active bleeding demonstrated. Portal venous phase demonstrates patent of the portal vein. C2 catheter was utilized to select the SMA origin and advanced into the SMA trunk over a Bentson guidewire. Selective SMA angiograms performed. SMA angiogram: SMA is widely patent. No ostial stenosis. Jejunal and colic branches are patent throughout the mesentery. No evidence of active contrast extravasation or bleeding. No other abnormal vascularity demonstrated. On the venous phase, the mesenteric veins and portal vein are patent. The C2  catheter and sheath removed. Hemostasis obtained with manual compression. No immediate complication. Patient tolerated the procedure well. IMPRESSION: Negative celiac and SMA angiograms for acute or active GI bleeding. Electronically Signed   By: Jerilynn Mages.  Shick M.D.   On: 11/26/2015 14:46   Ir US Guide Vasc Access Right  Result Date: 11/26/2015 INDICATION: Acute lower GI bleeding by nuclear medicine bleeding scan, originating in the hepatic flexure of the colon. This is concerning for a diverticular bleed. EXAM: SELECTIVE VISCERAL ARTERIOGRAPHY; IR ULTRASOUND GUIDANCE VASC ACCESS RIGHT MEDICATIONS: None. ANESTHESIA/SEDATION: Moderate (conscious) sedation was employed during this procedure. A total of Versed 1.0 mg and Fentanyl 50 mcg was administered intravenously. Moderate Sedation Time: 19 minutes. The patient's level of consciousness and vital signs were monitored continuously by radiology nursing throughout the procedure under my direct supervision. CONTRAST:  105 cc Isovue-300 FLUOROSCOPY TIME:  Fluoroscopy Time: 3 minutes 18 seconds (370 mGy). COMPLICATIONS: None immediate. PROCEDURE: Informed consent was obtained from the patient following explanation of the procedure, risks, benefits and alternatives. The patient understands, agrees and consents for the procedure. All questions were addressed. A time out was performed prior to the initiation of the procedure. Maximal barrier sterile technique utilized including caps, mask, sterile gowns, sterile gloves, large sterile drape, hand hygiene, and Betadine prep. Under sterile conditions and local anesthesia, ultrasound micropuncture access performed of the patent right common femoral artery. Images obtained for documentation. Over guidewire 5 French sheath was inserted. C2 catheter was  utilized to select the celiac origin. Selective celiac angiogram performed. Celiac angiogram: Celiac origin is widely patent. Splenic, left gastric, hepatic and gastroduodenal  arteries are all patent. No evidence of active bleeding demonstrated. Portal venous phase demonstrates patent of the portal vein. C2 catheter was utilized to select the SMA origin and advanced into the SMA trunk over a Bentson guidewire. Selective SMA angiograms performed. SMA angiogram: SMA is widely patent. No ostial stenosis. Jejunal and colic branches are patent throughout the mesentery. No evidence of active contrast extravasation or bleeding. No other abnormal vascularity demonstrated. On the venous phase, the mesenteric veins and portal vein are patent. The C2 catheter and sheath removed. Hemostasis obtained with manual compression. No immediate complication. Patient tolerated the procedure well. IMPRESSION: Negative celiac and SMA angiograms for acute or active GI bleeding. Electronically Signed   By: Jerilynn Mages.  Shick M.D.   On: 11/26/2015 14:46    Subjective: Pt was able to tolerate regular diet with no further acute GI bleeding and hemoglobin has remained stable.   Discharge Exam: Vitals:   11/27/15 0552 11/27/15 1358  BP: (!) 182/94 138/76  Pulse: 80 71  Resp: 17 17  Temp: 98.7 F (37.1 C) 98.2 F (36.8 C)   Vitals:   11/26/15 1500 11/26/15 2059 11/27/15 0552 11/27/15 1358  BP: (!) 153/84 (!) 143/89 (!) 182/94 138/76  Pulse: 70 69 80 71  Resp: 18 18 17 17   Temp:  98.9 F (37.2 C) 98.7 F (37.1 C) 98.2 F (36.8 C)  TempSrc:  Oral Oral Oral  SpO2: 100% 100% 100% 100%  Weight:      Height:        General: Pt is alert, awake, not in acute distress Cardiovascular: RRR, S1/S2 +, no rubs, no gallops Respiratory: CTA bilaterally, no wheezing, no rhonchi Abdominal: Soft, NT, ND, bowel sounds + Extremities: no edema, no cyanosis    The results of significant diagnostics from this hospitalization (including imaging, microbiology, ancillary and laboratory) are listed below for reference.     Microbiology: No results found for this or any previous visit (from the past 240 hour(s)).    Labs: BNP (last 3 results) No results for input(s): BNP in the last 8760 hours. Basic Metabolic Panel:  Recent Labs Lab 11/24/15 1531 11/25/15 0414 11/27/15 0519  NA 142 138 141  K 3.8 4.0 3.7  CL 105 108 109  CO2 29 26 27   GLUCOSE 99 103* 97  BUN 15 11 8   CREATININE 0.94 0.94 0.87  CALCIUM 9.6 8.6* 8.8*  MG 1.6*  --   --    Liver Function Tests:  Recent Labs Lab 11/24/15 1531  AST 23  ALT 25  ALKPHOS 37*  BILITOT 0.6  PROT 7.4  ALBUMIN 4.4   No results for input(s): LIPASE, AMYLASE in the last 168 hours. No results for input(s): AMMONIA in the last 168 hours. CBC:  Recent Labs Lab 11/24/15 1531  11/25/15 0414  11/26/15 1200 11/26/15 1908 11/27/15 0110 11/27/15 0519 11/27/15 1118  WBC 5.4  --  4.4  --   --   --   --  4.1  --   NEUTROABS 4.1  --   --   --   --   --   --  2.6  --   HGB 13.7  < > 12.9*  < > 12.9* 11.9* 11.3* 11.5* 12.6*  HCT 39.9  < > 37.6*  < > 36.9* 34.1* 32.7* 33.0* 36.5*  MCV 86.9  --  87.9  --   --   --   --  87.3  --   PLT 220  --  203  --   --   --   --  200  --   < > = values in this interval not displayed. Cardiac Enzymes: No results for input(s): CKTOTAL, CKMB, CKMBINDEX, TROPONINI in the last 168 hours. BNP: Invalid input(s): POCBNP CBG: No results for input(s): GLUCAP in the last 168 hours. D-Dimer No results for input(s): DDIMER in the last 72 hours. Hgb A1c No results for input(s): HGBA1C in the last 72 hours. Lipid Profile No results for input(s): CHOL, HDL, LDLCALC, TRIG, CHOLHDL, LDLDIRECT in the last 72 hours. Thyroid function studies No results for input(s): TSH, T4TOTAL, T3FREE, THYROIDAB in the last 72 hours.  Invalid input(s): FREET3 Anemia work up No results for input(s): VITAMINB12, FOLATE, FERRITIN, TIBC, IRON, RETICCTPCT in the last 72 hours. Urinalysis No results found for: COLORURINE, APPEARANCEUR, LABSPEC, [place], GLUCOSEU, HGBUR, BILIRUBINUR, KETONESUR, PROTEINUR, UROBILINOGEN, NITRITE,  LEUKOCYTESUR Sepsis Labs Invalid input(s): PROCALCITONIN,  WBC,  LACTICIDVEN Microbiology No results found for this or any previous visit (from the past 240 hour(s)).  Time coordinating discharge: 27 minutes  SIGNED:  Irwin Brakeman, MD  [facility] 11/27/2015, 5:29 PM Pager   If 7PM-7AM, please contact night-coverage [email or web address] Password [email or web address]

## 2015-11-27 NOTE — Progress Notes (Signed)
Patient ID: John Johnston, male   DOB: [birth year], 60 y.o.   MRN: [id number]    Progress Note   Subjective  HGB 11.5 stable Pt had one brown BM this am then a second BM which he showed me a picture of with old dark clot/stool small amt   Objective   Vital signs in last 24 hours: Temp:  [98.7 F (37.1 C)-98.9 F (37.2 C)] 98.7 F (37.1 C) (09/14 0552) Pulse Rate:  [69-80] 80 (09/14 0552) Resp:  [17-18] 17 (09/14 0552) BP: (143-182)/(84-94) 182/94 (09/14 0552) SpO2:  [100 %] 100 % (09/14 0552) Last BM Date: 11/26/15 General:    white male in NAD Heart:  Regular rate and rhythm; no murmurs Lungs: Respirations even and unlabored, lungs CTA bilaterally Abdomen:  Soft, nontender and nondistended. Normal bowel sounds. Extremities:  Without edema. Neurologic:  Alert and oriented,  grossly normal neurologically. Psych:  Cooperative. Normal mood and affect.  Intake/Output from previous day: 09/13 0701 - 09/14 0700 In: [id number] [P.O.:360; I.V.:1384] Out: -  Intake/Output this shift: No intake/output data recorded.  Lab Results:  Recent Labs  11/24/15 1531  11/25/15 0414  11/26/15 1908 11/27/15 0110 11/27/15 0519  WBC 5.4  --  4.4  --   --   --  4.1  HGB 13.7  < > 12.9*  < > 11.9* 11.3* 11.5*  HCT 39.9  < > 37.6*  < > 34.1* 32.7* 33.0*  PLT 220  --  203  --   --   --  200  < > = values in this interval not displayed. BMET  Recent Labs  11/24/15 1531 11/25/15 0414 11/27/15 0519  NA 142 138 141  K 3.8 4.0 3.7  CL 105 108 109  CO2 29 26 27   GLUCOSE 99 103* 97  BUN 15 11 8   CREATININE 0.94 0.94 0.87  CALCIUM 9.6 8.6* 8.8*   LFT  Recent Labs  11/24/15 1531  PROT 7.4  ALBUMIN 4.4  AST 23  ALT 25  ALKPHOS 37*  BILITOT 0.6   PT/INR No results for input(s): LABPROT, INR in the last 72 hours.  Studies/Results: Nm Gi Blood Loss  Result Date: 11/25/2015 CLINICAL DATA:  Concern for lower GI bleed. EXAM: NUCLEAR MEDICINE GASTROINTESTINAL BLEEDING SCAN TECHNIQUE:  Sequential abdominal images were obtained following intravenous administration of Tc-30m labeled red blood cells. RADIOPHARMACEUTICALS:  23.1 mCi Tc-55m in-vitro labeled red cells. COMPARISON:  None. FINDINGS: There is abnormal activity originating over the expected location of the hepatic flexure of the colon. This activity is noted to transit along the expected course of the transverse colon. Physiologic activity is seen within the spleen, liver and within the major abdominal vasculature. IMPRESSION: Scintigraphic findings worrisome for acute lower GI bleed with source of bleed suspected within the hepatic flexure of the colon. Above findings were discussed with Dr. Annamaria Boots, the on call interventional radiologist, at the time of procedure completion. Electronically Signed   By: Sandi Mariscal M.D.   On: 11/25/2015 18:13   Ir Angiogram Visceral Selective  Result Date: 11/26/2015 INDICATION: Acute lower GI bleeding by nuclear medicine bleeding scan, originating in the hepatic flexure of the colon. This is concerning for a diverticular bleed. EXAM: SELECTIVE VISCERAL ARTERIOGRAPHY; IR ULTRASOUND GUIDANCE VASC ACCESS RIGHT MEDICATIONS: None. ANESTHESIA/SEDATION: Moderate (conscious) sedation was employed during this procedure. A total of Versed 1.0 mg and Fentanyl 50 mcg was administered intravenously. Moderate Sedation Time: 19 minutes. The patient's level of consciousness and vital signs were monitored  continuously by radiology nursing throughout the procedure under my direct supervision. CONTRAST:  105 cc Isovue-300 FLUOROSCOPY TIME:  Fluoroscopy Time: 3 minutes 18 seconds (370 mGy). COMPLICATIONS: None immediate. PROCEDURE: Informed consent was obtained from the patient following explanation of the procedure, risks, benefits and alternatives. The patient understands, agrees and consents for the procedure. All questions were addressed. A time out was performed prior to the initiation of the procedure. Maximal  barrier sterile technique utilized including caps, mask, sterile gowns, sterile gloves, large sterile drape, hand hygiene, and Betadine prep. Under sterile conditions and local anesthesia, ultrasound micropuncture access performed of the patent right common femoral artery. Images obtained for documentation. Over guidewire 5 French sheath was inserted. C2 catheter was utilized to select the celiac origin. Selective celiac angiogram performed. Celiac angiogram: Celiac origin is widely patent. Splenic, left gastric, hepatic and gastroduodenal arteries are all patent. No evidence of active bleeding demonstrated. Portal venous phase demonstrates patent of the portal vein. C2 catheter was utilized to select the SMA origin and advanced into the SMA trunk over a Bentson guidewire. Selective SMA angiograms performed. SMA angiogram: SMA is widely patent. No ostial stenosis. Jejunal and colic branches are patent throughout the mesentery. No evidence of active contrast extravasation or bleeding. No other abnormal vascularity demonstrated. On the venous phase, the mesenteric veins and portal vein are patent. The C2 catheter and sheath removed. Hemostasis obtained with manual compression. No immediate complication. Patient tolerated the procedure well. IMPRESSION: Negative celiac and SMA angiograms for acute or active GI bleeding. Electronically Signed   By: Jerilynn Mages.  Shick M.D.   On: 11/26/2015 14:46   Ir Angiogram Visceral Selective  Result Date: 11/26/2015 INDICATION: Acute lower GI bleeding by nuclear medicine bleeding scan, originating in the hepatic flexure of the colon. This is concerning for a diverticular bleed. EXAM: SELECTIVE VISCERAL ARTERIOGRAPHY; IR ULTRASOUND GUIDANCE VASC ACCESS RIGHT MEDICATIONS: None. ANESTHESIA/SEDATION: Moderate (conscious) sedation was employed during this procedure. A total of Versed 1.0 mg and Fentanyl 50 mcg was administered intravenously. Moderate Sedation Time: 19 minutes. The patient's  level of consciousness and vital signs were monitored continuously by radiology nursing throughout the procedure under my direct supervision. CONTRAST:  105 cc Isovue-300 FLUOROSCOPY TIME:  Fluoroscopy Time: 3 minutes 18 seconds (370 mGy). COMPLICATIONS: None immediate. PROCEDURE: Informed consent was obtained from the patient following explanation of the procedure, risks, benefits and alternatives. The patient understands, agrees and consents for the procedure. All questions were addressed. A time out was performed prior to the initiation of the procedure. Maximal barrier sterile technique utilized including caps, mask, sterile gowns, sterile gloves, large sterile drape, hand hygiene, and Betadine prep. Under sterile conditions and local anesthesia, ultrasound micropuncture access performed of the patent right common femoral artery. Images obtained for documentation. Over guidewire 5 French sheath was inserted. C2 catheter was utilized to select the celiac origin. Selective celiac angiogram performed. Celiac angiogram: Celiac origin is widely patent. Splenic, left gastric, hepatic and gastroduodenal arteries are all patent. No evidence of active bleeding demonstrated. Portal venous phase demonstrates patent of the portal vein. C2 catheter was utilized to select the SMA origin and advanced into the SMA trunk over a Bentson guidewire. Selective SMA angiograms performed. SMA angiogram: SMA is widely patent. No ostial stenosis. Jejunal and colic branches are patent throughout the mesentery. No evidence of active contrast extravasation or bleeding. No other abnormal vascularity demonstrated. On the venous phase, the mesenteric veins and portal vein are patent. The C2 catheter and sheath removed. Hemostasis  obtained with manual compression. No immediate complication. Patient tolerated the procedure well. IMPRESSION: Negative celiac and SMA angiograms for acute or active GI bleeding. Electronically Signed   By: Jerilynn Mages.  Shick  M.D.   On: 11/26/2015 14:46   Ir US Guide Vasc Access Right  Result Date: 11/26/2015 INDICATION: Acute lower GI bleeding by nuclear medicine bleeding scan, originating in the hepatic flexure of the colon. This is concerning for a diverticular bleed. EXAM: SELECTIVE VISCERAL ARTERIOGRAPHY; IR ULTRASOUND GUIDANCE VASC ACCESS RIGHT MEDICATIONS: None. ANESTHESIA/SEDATION: Moderate (conscious) sedation was employed during this procedure. A total of Versed 1.0 mg and Fentanyl 50 mcg was administered intravenously. Moderate Sedation Time: 19 minutes. The patient's level of consciousness and vital signs were monitored continuously by radiology nursing throughout the procedure under my direct supervision. CONTRAST:  105 cc Isovue-300 FLUOROSCOPY TIME:  Fluoroscopy Time: 3 minutes 18 seconds (370 mGy). COMPLICATIONS: None immediate. PROCEDURE: Informed consent was obtained from the patient following explanation of the procedure, risks, benefits and alternatives. The patient understands, agrees and consents for the procedure. All questions were addressed. A time out was performed prior to the initiation of the procedure. Maximal barrier sterile technique utilized including caps, mask, sterile gowns, sterile gloves, large sterile drape, hand hygiene, and Betadine prep. Under sterile conditions and local anesthesia, ultrasound micropuncture access performed of the patent right common femoral artery. Images obtained for documentation. Over guidewire 5 French sheath was inserted. C2 catheter was utilized to select the celiac origin. Selective celiac angiogram performed. Celiac angiogram: Celiac origin is widely patent. Splenic, left gastric, hepatic and gastroduodenal arteries are all patent. No evidence of active bleeding demonstrated. Portal venous phase demonstrates patent of the portal vein. C2 catheter was utilized to select the SMA origin and advanced into the SMA trunk over a Bentson guidewire. Selective SMA angiograms  performed. SMA angiogram: SMA is widely patent. No ostial stenosis. Jejunal and colic branches are patent throughout the mesentery. No evidence of active contrast extravasation or bleeding. No other abnormal vascularity demonstrated. On the venous phase, the mesenteric veins and portal vein are patent. The C2 catheter and sheath removed. Hemostasis obtained with manual compression. No immediate complication. Patient tolerated the procedure well. IMPRESSION: Negative celiac and SMA angiograms for acute or active GI bleeding. Electronically Signed   By: Jerilynn Mages.  Shick M.D.   On: 11/26/2015 14:46       Assessment / Plan:    #1 60 yo WM with acute GI Bleed secondary to right sided diverticular disease .Marland KitchenBelieve bleeding has stopped and he is still clearing out old blood from bowel. Will advance diet to regular  Check another HGb this afternoon and if remaining stable he can be discharged home  No Asa or NSAIDS for now He will come to office lab on Monday for repeat HGB  #2 Hx adenomatous polyps- 2 polyps found at colonoscopy this admit and not removed - he is already scheduled for follow up colonoscopy with Dr Carlean Purl in October and he will keep that appt    Active Problems:   GERD (gastroesophageal reflux disease)   GI bleeding   Lower GI bleed   Blood in stool     LOS: 3 days   Quinlee Sciarra  11/27/2015, 9:57 AM

## 2015-11-27 NOTE — Progress Notes (Signed)
Scant loose maroon/dark stool at 0600 hgb 11.5

## 2015-11-27 NOTE — Progress Notes (Signed)
Discharge instructions given to pt, verbalized understanding. Awaiting family for transportation home.

## 2015-11-27 NOTE — Progress Notes (Signed)
PROGRESS NOTE  John Johnston [id number] DOB: [birth year] DOA: 11/24/2015 PCP: Mauricio Po, FNP   LOS: 3 days   Brief Narrative: 60 y.o. male  without any significant past medical history other than reflux, prior history of esophageal strictures requiring dilatation, history of degenerative disc disease and a C-spine for which she sees Dr. Joya Salm, presents to the emergency room with chief complaints of GI bleed.  Assessment & Plan: Active Problems:   GERD (gastroesophageal reflux disease)   GI bleeding   Lower GI bleed   Blood in stool  Acute lower GI bleeding - Pt has right sided diverticulosis - bleeding scan was positive right colon, angio negative - Hb remained stable - GI advancing to regular diet, possible dc later today if tolerates and has BM with no new blood, Hg remains stable  GERD - stable on PPI therapy  Elevated blood pressures - following, if continue to remain elevated, consider starting tx for essential hypertension. IVF KVO.  DVT prophylaxis: SCD Code Status: Full  Family Communication: d/w wife Pam Disposition Plan: home later today if stable  Consultants:   GI  Procedures:   EGD/Colon pending  Antimicrobials:  none   Subjective: Pt did have a blood stool this morning   Objective: Vitals:   11/26/15 0447 11/26/15 1500 11/26/15 2059 11/27/15 0552  BP: (!) 149/77 (!) 153/84 (!) 143/89 (!) 182/94  Pulse: 61 70 69 80  Resp: 18 18 18 17   Temp: 98.3 F (36.8 C)  98.9 F (37.2 C) 98.7 F (37.1 C)  TempSrc: Oral  Oral Oral  SpO2: 100% 100% 100% 100%  Weight:      Height:        Intake/Output Summary (Last 24 hours) at 11/27/15 1259 Last data filed at 11/27/15 0800  Gross per 24 hour  Intake             1984 ml  Output                0 ml  Net             1984 ml   Filed Weights   11/25/15 1257  Weight: 73.5 kg (162 lb)    Examination: Constitutional: NAD Vitals:   11/26/15 0447 11/26/15 1500 11/26/15 2059 11/27/15 0552  BP:  (!) 149/77 (!) 153/84 (!) 143/89 (!) 182/94  Pulse: 61 70 69 80  Resp: 18 18 18 17   Temp: 98.3 F (36.8 C)  98.9 F (37.2 C) 98.7 F (37.1 C)  TempSrc: Oral  Oral Oral  SpO2: 100% 100% 100% 100%  Weight:      Height:       General: cooperative pleasant  NAD Heart:  Regular rate and rhythm; no murmurs Lungs: Respirations even and unlabored, lungs CTA bilaterally Abdomen:  Soft, nontender and nondistended. Normal bowel sounds. Extremities:  Without edema. Neurologic:  Alert and oriented,  grossly normal neurologically. Psych:  Cooperative. Normal mood and affect.  Data Reviewed: I have personally reviewed following labs and imaging studies  CBC:  Recent Labs Lab 11/24/15 1531  11/25/15 0414  11/26/15 1200 11/26/15 1908 11/27/15 0110 11/27/15 0519 11/27/15 1118  WBC 5.4  --  4.4  --   --   --   --  4.1  --   NEUTROABS 4.1  --   --   --   --   --   --  2.6  --   HGB 13.7  < > 12.9*  < > 12.9* 11.9*  11.3* 11.5* 12.6*  HCT 39.9  < > 37.6*  < > 36.9* 34.1* 32.7* 33.0* 36.5*  MCV 86.9  --  87.9  --   --   --   --  87.3  --   PLT 220  --  203  --   --   --   --  200  --   < > = values in this interval not displayed. Basic Metabolic Panel:  Recent Labs Lab 11/24/15 1531 11/25/15 0414 11/27/15 0519  NA 142 138 141  K 3.8 4.0 3.7  CL 105 108 109  CO2 29 26 27   GLUCOSE 99 103* 97  BUN 15 11 8   CREATININE 0.94 0.94 0.87  CALCIUM 9.6 8.6* 8.8*  MG 1.6*  --   --    GFR: Estimated Creatinine Clearance: 95.6 mL/min (by C-G formula based on SCr of 0.87 mg/dL). Liver Function Tests:  Recent Labs Lab 11/24/15 1531  AST 23  ALT 25  ALKPHOS 37*  BILITOT 0.6  PROT 7.4  ALBUMIN 4.4   No results for input(s): LIPASE, AMYLASE in the last 168 hours. No results for input(s): AMMONIA in the last 168 hours. Coagulation Profile: No results for input(s): INR, PROTIME in the last 168 hours. Cardiac Enzymes: No results for input(s): CKTOTAL, CKMB, CKMBINDEX, TROPONINI in the  last 168 hours. BNP (last 3 results) No results for input(s): PROBNP in the last 8760 hours. HbA1C: No results for input(s): HGBA1C in the last 72 hours. CBG: No results for input(s): GLUCAP in the last 168 hours. Lipid Profile: No results for input(s): CHOL, HDL, LDLCALC, TRIG, CHOLHDL, LDLDIRECT in the last 72 hours. Thyroid Function Tests: No results for input(s): TSH, T4TOTAL, FREET4, T3FREE, THYROIDAB in the last 72 hours. Anemia Panel:  Recent Labs  11/24/15 1531  VITAMINB12 256   Urine analysis: No results found for: COLORURINE, APPEARANCEUR, LABSPEC, PHURINE, GLUCOSEU, HGBUR, BILIRUBINUR, KETONESUR, PROTEINUR, UROBILINOGEN, NITRITE, LEUKOCYTESUR Sepsis Labs: Invalid input(s): PROCALCITONIN, LACTICIDVEN  No results found for this or any previous visit (from the past 240 hour(s)).    Radiology Studies: Nm Gi Blood Loss  Result Date: 11/25/2015 CLINICAL DATA:  Concern for lower GI bleed. EXAM: NUCLEAR MEDICINE GASTROINTESTINAL BLEEDING SCAN TECHNIQUE: Sequential abdominal images were obtained following intravenous administration of Tc-14m labeled red blood cells. RADIOPHARMACEUTICALS:  23.1 mCi Tc-50m in-vitro labeled red cells. COMPARISON:  None. FINDINGS: There is abnormal activity originating over the expected location of the hepatic flexure of the colon. This activity is noted to transit along the expected course of the transverse colon. Physiologic activity is seen within the spleen, liver and within the major abdominal vasculature. IMPRESSION: Scintigraphic findings worrisome for acute lower GI bleed with source of bleed suspected within the hepatic flexure of the colon. Above findings were discussed with Dr. Annamaria Boots, the on call interventional radiologist, at the time of procedure completion. Electronically Signed   By: Sandi Mariscal M.D.   On: 11/25/2015 18:13   Ir Angiogram Visceral Selective  Result Date: 11/26/2015 INDICATION: Acute lower GI bleeding by nuclear medicine  bleeding scan, originating in the hepatic flexure of the colon. This is concerning for a diverticular bleed. EXAM: SELECTIVE VISCERAL ARTERIOGRAPHY; IR ULTRASOUND GUIDANCE VASC ACCESS RIGHT MEDICATIONS: None. ANESTHESIA/SEDATION: Moderate (conscious) sedation was employed during this procedure. A total of Versed 1.0 mg and Fentanyl 50 mcg was administered intravenously. Moderate Sedation Time: 19 minutes. The patient's level of consciousness and vital signs were monitored continuously by radiology nursing throughout the procedure under my  direct supervision. CONTRAST:  105 cc Isovue-300 FLUOROSCOPY TIME:  Fluoroscopy Time: 3 minutes 18 seconds (370 mGy). COMPLICATIONS: None immediate. PROCEDURE: Informed consent was obtained from the patient following explanation of the procedure, risks, benefits and alternatives. The patient understands, agrees and consents for the procedure. All questions were addressed. A time out was performed prior to the initiation of the procedure. Maximal barrier sterile technique utilized including caps, mask, sterile gowns, sterile gloves, large sterile drape, hand hygiene, and Betadine prep. Under sterile conditions and local anesthesia, ultrasound micropuncture access performed of the patent right common femoral artery. Images obtained for documentation. Over guidewire 5 French sheath was inserted. C2 catheter was utilized to select the celiac origin. Selective celiac angiogram performed. Celiac angiogram: Celiac origin is widely patent. Splenic, left gastric, hepatic and gastroduodenal arteries are all patent. No evidence of active bleeding demonstrated. Portal venous phase demonstrates patent of the portal vein. C2 catheter was utilized to select the SMA origin and advanced into the SMA trunk over a Bentson guidewire. Selective SMA angiograms performed. SMA angiogram: SMA is widely patent. No ostial stenosis. Jejunal and colic branches are patent throughout the mesentery. No evidence  of active contrast extravasation or bleeding. No other abnormal vascularity demonstrated. On the venous phase, the mesenteric veins and portal vein are patent. The C2 catheter and sheath removed. Hemostasis obtained with manual compression. No immediate complication. Patient tolerated the procedure well. IMPRESSION: Negative celiac and SMA angiograms for acute or active GI bleeding. Electronically Signed   By: Jerilynn Mages.  Shick M.D.   On: 11/26/2015 14:46   Ir Angiogram Visceral Selective  Result Date: 11/26/2015 INDICATION: Acute lower GI bleeding by nuclear medicine bleeding scan, originating in the hepatic flexure of the colon. This is concerning for a diverticular bleed. EXAM: SELECTIVE VISCERAL ARTERIOGRAPHY; IR ULTRASOUND GUIDANCE VASC ACCESS RIGHT MEDICATIONS: None. ANESTHESIA/SEDATION: Moderate (conscious) sedation was employed during this procedure. A total of Versed 1.0 mg and Fentanyl 50 mcg was administered intravenously. Moderate Sedation Time: 19 minutes. The patient's level of consciousness and vital signs were monitored continuously by radiology nursing throughout the procedure under my direct supervision. CONTRAST:  105 cc Isovue-300 FLUOROSCOPY TIME:  Fluoroscopy Time: 3 minutes 18 seconds (370 mGy). COMPLICATIONS: None immediate. PROCEDURE: Informed consent was obtained from the patient following explanation of the procedure, risks, benefits and alternatives. The patient understands, agrees and consents for the procedure. All questions were addressed. A time out was performed prior to the initiation of the procedure. Maximal barrier sterile technique utilized including caps, mask, sterile gowns, sterile gloves, large sterile drape, hand hygiene, and Betadine prep. Under sterile conditions and local anesthesia, ultrasound micropuncture access performed of the patent right common femoral artery. Images obtained for documentation. Over guidewire 5 French sheath was inserted. C2 catheter was utilized to  select the celiac origin. Selective celiac angiogram performed. Celiac angiogram: Celiac origin is widely patent. Splenic, left gastric, hepatic and gastroduodenal arteries are all patent. No evidence of active bleeding demonstrated. Portal venous phase demonstrates patent of the portal vein. C2 catheter was utilized to select the SMA origin and advanced into the SMA trunk over a Bentson guidewire. Selective SMA angiograms performed. SMA angiogram: SMA is widely patent. No ostial stenosis. Jejunal and colic branches are patent throughout the mesentery. No evidence of active contrast extravasation or bleeding. No other abnormal vascularity demonstrated. On the venous phase, the mesenteric veins and portal vein are patent. The C2 catheter and sheath removed. Hemostasis obtained with manual compression. No immediate complication. Patient tolerated  the procedure well. IMPRESSION: Negative celiac and SMA angiograms for acute or active GI bleeding. Electronically Signed   By: Jerilynn Mages.  Shick M.D.   On: 11/26/2015 14:46   Ir US Guide Vasc Access Right  Result Date: 11/26/2015 INDICATION: Acute lower GI bleeding by nuclear medicine bleeding scan, originating in the hepatic flexure of the colon. This is concerning for a diverticular bleed. EXAM: SELECTIVE VISCERAL ARTERIOGRAPHY; IR ULTRASOUND GUIDANCE VASC ACCESS RIGHT MEDICATIONS: None. ANESTHESIA/SEDATION: Moderate (conscious) sedation was employed during this procedure. A total of Versed 1.0 mg and Fentanyl 50 mcg was administered intravenously. Moderate Sedation Time: 19 minutes. The patient's level of consciousness and vital signs were monitored continuously by radiology nursing throughout the procedure under my direct supervision. CONTRAST:  105 cc Isovue-300 FLUOROSCOPY TIME:  Fluoroscopy Time: 3 minutes 18 seconds (370 mGy). COMPLICATIONS: None immediate. PROCEDURE: Informed consent was obtained from the patient following explanation of the procedure, risks, benefits  and alternatives. The patient understands, agrees and consents for the procedure. All questions were addressed. A time out was performed prior to the initiation of the procedure. Maximal barrier sterile technique utilized including caps, mask, sterile gowns, sterile gloves, large sterile drape, hand hygiene, and Betadine prep. Under sterile conditions and local anesthesia, ultrasound micropuncture access performed of the patent right common femoral artery. Images obtained for documentation. Over guidewire 5 French sheath was inserted. C2 catheter was utilized to select the celiac origin. Selective celiac angiogram performed. Celiac angiogram: Celiac origin is widely patent. Splenic, left gastric, hepatic and gastroduodenal arteries are all patent. No evidence of active bleeding demonstrated. Portal venous phase demonstrates patent of the portal vein. C2 catheter was utilized to select the SMA origin and advanced into the SMA trunk over a Bentson guidewire. Selective SMA angiograms performed. SMA angiogram: SMA is widely patent. No ostial stenosis. Jejunal and colic branches are patent throughout the mesentery. No evidence of active contrast extravasation or bleeding. No other abnormal vascularity demonstrated. On the venous phase, the mesenteric veins and portal vein are patent. The C2 catheter and sheath removed. Hemostasis obtained with manual compression. No immediate complication. Patient tolerated the procedure well. IMPRESSION: Negative celiac and SMA angiograms for acute or active GI bleeding. Electronically Signed   By: Jerilynn Mages.  Shick M.D.   On: 11/26/2015 14:46   Scheduled Meds: . pantoprazole  40 mg Oral Q0600   Continuous Infusions: . sodium chloride 50 mL/hr at 11/26/15 1930   C. Wynetta Emery, MD [facility] Pager [phone]  If 7PM-7AM, please contact night-coverage [email or web address] Password [facility] 11/27/2015, 12:59 PM

## 2015-12-01 ENCOUNTER — Telehealth: Payer: Self-pay | Admitting: Physician Assistant

## 2015-12-01 ENCOUNTER — Encounter: Payer: Self-pay | Admitting: Family

## 2015-12-01 ENCOUNTER — Other Ambulatory Visit (INDEPENDENT_AMBULATORY_CARE_PROVIDER_SITE_OTHER): Payer: 59

## 2015-12-01 DIAGNOSIS — K921 Melena: Secondary | ICD-10-CM | POA: Diagnosis not present

## 2015-12-01 LAB — CBC WITH DIFFERENTIAL/PLATELET
BASOS PCT: 0.7 % (ref 0.0–3.0)
Basophils Absolute: 0.1 10*3/uL (ref 0.0–0.1)
EOS ABS: 0.2 10*3/uL (ref 0.0–0.7)
EOS PCT: 2.2 % (ref 0.0–5.0)
HCT: 36 % — ABNORMAL LOW (ref 39.0–52.0)
Hemoglobin: 12.3 g/dL — ABNORMAL LOW (ref 13.0–17.0)
LYMPHS ABS: 0.9 10*3/uL (ref 0.7–4.0)
Lymphocytes Relative: 12.2 % (ref 12.0–46.0)
MCHC: 34.2 g/dL (ref 30.0–36.0)
MCV: 88.7 fl (ref 78.0–100.0)
MONO ABS: 0.6 10*3/uL (ref 0.1–1.0)
Monocytes Relative: 8.7 % (ref 3.0–12.0)
NEUTROS ABS: 5.4 10*3/uL (ref 1.4–7.7)
NEUTROS PCT: 76.2 % (ref 43.0–77.0)
PLATELETS: 244 10*3/uL (ref 150.0–400.0)
RBC: 4.06 Mil/uL — ABNORMAL LOW (ref 4.22–5.81)
RDW: 12.9 % (ref 11.5–15.5)
WBC: 7 10*3/uL (ref 4.0–10.5)

## 2015-12-01 LAB — METHYLMALONIC ACID, SERUM: METHYLMALONIC ACID, QUANTITATIVE: 165 nmol/L (ref 0–378)

## 2015-12-01 NOTE — Telephone Encounter (Signed)
hgb value given. No recommendation from the provider yet. Understands.

## 2015-12-18 ENCOUNTER — Telehealth: Payer: Self-pay

## 2015-12-18 NOTE — Telephone Encounter (Signed)
Dr Carlean Purl,      Suboptimal prep 11/25/15 due to blood and clots throughout entire colon.  Would you like a 2 day prep with Miralax and Suprep or proceed with Miralax only?                                                                             Angela/PV

## 2015-12-18 NOTE — Telephone Encounter (Signed)
I think that was due to the bleeding and urgent prep etc  Have him take 4 dulcolax 2 days before colonoscopy and then do a typical MiraLax prep 1 day before

## 2015-12-22 ENCOUNTER — Other Ambulatory Visit: Payer: Self-pay

## 2015-12-22 ENCOUNTER — Ambulatory Visit (AMBULATORY_SURGERY_CENTER): Payer: Self-pay

## 2015-12-22 ENCOUNTER — Other Ambulatory Visit (INDEPENDENT_AMBULATORY_CARE_PROVIDER_SITE_OTHER): Payer: 59

## 2015-12-22 VITALS — Ht 69.0 in | Wt 167.0 lb

## 2015-12-22 DIAGNOSIS — K922 Gastrointestinal hemorrhage, unspecified: Secondary | ICD-10-CM

## 2015-12-22 DIAGNOSIS — K921 Melena: Secondary | ICD-10-CM

## 2015-12-22 LAB — HEMOGLOBIN: HEMOGLOBIN: 13.2 g/dL (ref 13.0–17.0)

## 2015-12-22 NOTE — Progress Notes (Signed)
Hgb back to normal - My Chart message

## 2015-12-22 NOTE — Progress Notes (Signed)
No allergies to eggs or soy No past problems with anesthesia No diet meds No home oxygen  Declined emmi 

## 2015-12-23 MED FILL — CIALIS 20 MG TABLET: 20 | 30 days supply | Qty: 6 | Fill #5

## 2015-12-23 MED FILL — GABAPENTIN 300 MG CAPSULE: 300 | 30 days supply | Qty: 90 | Fill #1

## 2015-12-23 MED FILL — MOMETASONE FUROATE 50 MCG S: 50 | 30 days supply | Qty: 17 | Fill #2

## 2016-01-05 ENCOUNTER — Encounter: Payer: Self-pay | Admitting: Internal Medicine

## 2016-01-05 ENCOUNTER — Ambulatory Visit (AMBULATORY_SURGERY_CENTER): Payer: 59 | Admitting: Internal Medicine

## 2016-01-05 VITALS — BP 138/77 | HR 51 | Temp 98.4°F | Resp 35 | Ht 70.0 in | Wt 162.0 lb

## 2016-01-05 DIAGNOSIS — Z1211 Encounter for screening for malignant neoplasm of colon: Secondary | ICD-10-CM | POA: Diagnosis not present

## 2016-01-05 DIAGNOSIS — Z86010 Personal history of colonic polyps: Secondary | ICD-10-CM

## 2016-01-05 DIAGNOSIS — D125 Benign neoplasm of sigmoid colon: Secondary | ICD-10-CM | POA: Diagnosis not present

## 2016-01-05 DIAGNOSIS — K635 Polyp of colon: Secondary | ICD-10-CM

## 2016-01-05 MED ORDER — SODIUM CHLORIDE 0.9 % IV SOLN: 500.0000 mL | INTRAVENOUS | Status: DC

## 2016-01-05 NOTE — Progress Notes (Signed)
A/ox3 pleased with MAC, report to April RN 

## 2016-01-05 NOTE — Op Note (Signed)
[facility] Patient Name: John Johnston Procedure Date: 01/05/2016 8:23 AM MRN: [id number] Endoscopist: Gatha Mayer , MD Age: 60 Referring MD:  Date of Birth: [birth year] Gender: Male Account #: [id number] Procedure:                Colonoscopy Indications:              Surveillance: Personal history of adenomatous                            polyps on last colonoscopy 5 years ago Medicines:                Propofol per Anesthesia, Monitored Anesthesia Care Procedure:                Pre-Anesthesia Assessment:                           - Prior to the procedure, a History and Physical                            was performed, and patient medications and                            allergies were reviewed. The patient's tolerance of                            previous anesthesia was also reviewed. The risks                            and benefits of the procedure and the sedation                            options and risks were discussed with the patient.                            All questions were answered, and informed consent                            was obtained. Prior Anticoagulants: The patient has                            taken no previous anticoagulant or antiplatelet                            agents. ASA Grade Assessment: II - A patient with                            mild systemic disease. After reviewing the risks                            and benefits, the patient was deemed in                            satisfactory condition to undergo the procedure.  After obtaining informed consent, the colonoscope                            was passed under direct vision. Throughout the                            procedure, the patient's blood pressure, pulse, and                            oxygen saturations were monitored continuously. The                            Model CF-HQ190L [id number]) scope was introduced   through the anus and advanced to the the terminal                            ileum, with identification of the appendiceal                            orifice and IC valve. The colonoscopy was performed                            without difficulty. The terminal ileum, ileocecal                            valve, appendiceal orifice, and rectum were                            photographed. The quality of the bowel preparation                            was excellent. The bowel preparation used was                            Miralax. Scope In: 8:35:12 AM Scope Out: 8:53:41 AM Scope Withdrawal Time: 0 hours 16 minutes 31 seconds  Total Procedure Duration: 0 hours 18 minutes 29 seconds  Findings:                 The perianal exam findings include skin tags.                           The digital rectal exam was normal. Pertinent                            negatives include normal prostate (size, shape, and                            consistency).                           Two sessile polyps were found in the sigmoid colon.                            The polyps were 4 to 5 mm in size. These  polyps                            were removed with a cold snare. Resection and                            retrieval were complete. Verification of patient                            identification for the specimen was done. Estimated                            blood loss was minimal.                           A 1 mm polyp was found in the sigmoid colon. The                            polyp was sessile. The polyp was removed with a                            cold biopsy forceps. Resection and retrieval were                            complete. Verification of patient identification                            for the specimen was done. Estimated blood loss was                            minimal.                           Many diverticula were found in the entire colon.                           Internal  hemorrhoids were found.                           The exam was otherwise without abnormality on                            direct and retroflexion views. Complications:            No immediate complications. Estimated Blood Loss:     Estimated blood loss was minimal. Impression:               - Perianal skin tags found on perianal exam.                           - Two 4 to 5 mm polyps in the sigmoid colon,                            removed with a cold snare. Resected and retrieved.                           -  One 1 mm polyp in the sigmoid colon, removed with                            a cold biopsy forceps. Resected and retrieved.                           - Diverticulosis in the entire examined colon.                           - Internal hemorrhoids.                           - The examination was otherwise normal on direct                            and retroflexion views.                           - Personal history of colonic polyps. 2 adenomas                            2012 Recommendation:           - Patient has a contact number available for                            emergencies. The signs and symptoms of potential                            delayed complications were discussed with the                            patient. Return to normal activities tomorrow.                            Written discharge instructions were provided to the                            patient.                           - Resume previous diet.                           - Continue present medications.                           - Repeat colonoscopy is recommended for                            surveillance. The colonoscopy date will be                            determined after pathology results from today's                            exam become  available for review. Gatha Mayer, MD 01/05/2016 9:01:31 AM This report has been signed electronically.

## 2016-01-05 NOTE — Patient Instructions (Addendum)
I found and removed 3 small polyps that look benign.  Diverticulosis and hemorrhoids still there.  I will let you know pathology results and when to have another routine colonoscopy by mail.  I appreciate the opportunity to care for you. Gatha Mayer, MD, FACG   YOU HAD AN ENDOSCOPIC PROCEDURE TODAY AT [facility]:   Refer to the procedure report that was given to you for any specific questions about what was found during the examination.  If the procedure report does not answer your questions, please call your gastroenterologist to clarify.  If you requested that your care partner not be given the details of your procedure findings, then the procedure report has been included in a sealed envelope for you to review at your convenience later.  YOU SHOULD EXPECT: Some feelings of bloating in the abdomen. Passage of more gas than usual.  Walking can help get rid of the air that was put into your GI tract during the procedure and reduce the bloating. If you had a lower endoscopy (such as a colonoscopy or flexible sigmoidoscopy) you may notice spotting of blood in your stool or on the toilet paper. If you underwent a bowel prep for your procedure, you may not have a normal bowel movement for a few days.  Please Note:  You might notice some irritation and congestion in your nose or some drainage.  This is from the oxygen used during your procedure.  There is no need for concern and it should clear up in a day or so.  SYMPTOMS TO REPORT IMMEDIATELY:   Following lower endoscopy (colonoscopy or flexible sigmoidoscopy):  Excessive amounts of blood in the stool  Significant tenderness or worsening of abdominal pains  Swelling of the abdomen that is new, acute  Fever of 100F or higher   For urgent or emergent issues, a gastroenterologist can be reached at any hour by calling [phone].   DIET:  We do recommend a small meal at first, but then you may proceed to your  regular diet.  Drink plenty of fluids but you should avoid alcoholic beverages for 24 hours.  ACTIVITY:  You should plan to take it easy for the rest of today and you should NOT DRIVE or use heavy machinery until tomorrow (because of the sedation medicines used during the test).    FOLLOW UP: Our staff will call the number listed on your records the next business day following your procedure to check on you and address any questions or concerns that you may have regarding the information given to you following your procedure. If we do not reach you, we will leave a message.  However, if you are feeling well and you are not experiencing any problems, there is no need to return our call.  We will assume that you have returned to your regular daily activities without incident.  If any biopsies were taken you will be contacted by phone or by letter within the next 1-3 weeks.  Please call us at [phone] if you have not heard about the biopsies in 3 weeks.    SIGNATURES/CONFIDENTIALITY: You and/or your care partner have signed paperwork which will be entered into your electronic medical record.  These signatures attest to the fact that that the information above on your After Visit Summary has been reviewed and is understood.  Full responsibility of the confidentiality of this discharge information lies with you and/or your care-partner.  Polyps, diverticulosis, hemorrhoids-handouts given  Repeat colonoscopy will be determined by pathology.

## 2016-01-05 NOTE — Progress Notes (Signed)
Called to room to assist during endoscopic procedure.  Patient ID and intended procedure confirmed with present staff. Received instructions for my participation in the procedure from the performing physician.  

## 2016-01-06 ENCOUNTER — Telehealth: Payer: Self-pay

## 2016-01-06 NOTE — Telephone Encounter (Signed)
Attempted to reach pt. For follow up call following procedure yesterday.   LM of pt.'s ans. Machine to call if he has any questions or concerns..   Will try to call again later today.

## 2016-01-07 ENCOUNTER — Telehealth: Payer: Self-pay | Admitting: *Deleted

## 2016-01-07 NOTE — Telephone Encounter (Signed)
  Follow up Call-  Call back number 01/05/2016 04/04/2014  Post procedure Call Back phone  # [phone]  Permission to leave phone message Yes Yes  Some recent data might be hidden     Patient questions:  Do you have a fever, pain , or abdominal swelling? No. Pain Score  0 *  Have you tolerated food without any problems? Yes.    Have you been able to return to your normal activities? Yes.    Do you have any questions about your discharge instructions: Diet   No. Medications  No. Follow up visit  No.  Do you have questions or concerns about your Care? No.  Actions: * If pain score is 4 or above: No action needed, pain <4.

## 2016-01-08 ENCOUNTER — Encounter: Payer: Self-pay | Admitting: Internal Medicine

## 2016-01-08 DIAGNOSIS — Z86010 Personal history of colonic polyps: Secondary | ICD-10-CM

## 2016-01-08 NOTE — Progress Notes (Signed)
2 small adenomas one hyperplastic Recall colonoscopy 2020

## 2016-01-21 MED FILL — ANDROGEL 1.62% GEL PUMP: 20.25 MG/AC | 30 days supply | Qty: 75 | Fill #0

## 2016-02-12 MED FILL — PANTOPRAZOLE SOD DR 40 MG T: 40 | 90 days supply | Qty: 90 | Fill #2

## 2016-02-12 MED FILL — CIALIS 20 MG TABLET: 20 | 30 days supply | Qty: 6 | Fill #6

## 2016-02-12 MED FILL — MOMETASONE FUROATE 50 MCG S: 50 | 30 days supply | Qty: 17 | Fill #3

## 2016-02-12 MED FILL — ANDROGEL 1.62% GEL PUMP: 20.25 MG/AC | 30 days supply | Qty: 75 | Fill #1

## 2016-03-26 MED FILL — GABAPENTIN 300 MG CAPSULE: 300 | 30 days supply | Qty: 90 | Fill #2

## 2016-03-26 MED FILL — ANDROGEL 1.62% GEL PUMP: 20.25 MG/AC | 30 days supply | Qty: 75 | Fill #2

## 2016-04-19 DIAGNOSIS — M50122 Cervical disc disorder at C5-C6 level with radiculopathy: Secondary | ICD-10-CM | POA: Diagnosis not present

## 2016-04-19 DIAGNOSIS — M7542 Impingement syndrome of left shoulder: Secondary | ICD-10-CM | POA: Diagnosis not present

## 2016-04-19 DIAGNOSIS — M7541 Impingement syndrome of right shoulder: Secondary | ICD-10-CM | POA: Diagnosis not present

## 2016-04-19 MED FILL — predniSONE 5 MG (21) TBPK: 5 | 6 days supply | Qty: 21 | Fill #0

## 2016-04-27 ENCOUNTER — Other Ambulatory Visit: Payer: Self-pay | Admitting: Internal Medicine

## 2016-04-28 ENCOUNTER — Telehealth: Payer: Self-pay

## 2016-04-28 NOTE — Telephone Encounter (Signed)
Did a cover my meds for Nexium  40mg  , one BID for GERD, K21.9.  Patient has tried /failed pantoprazole 40mg , omeprazole 20mg , Pepcid OTC. Will await outcome.

## 2016-05-05 MED FILL — ANDROGEL 1.62% GEL PUMP: 20.25 MG/AC | 30 days supply | Qty: 75 | Fill #3

## 2016-05-06 MED ORDER — PANTOPRAZOLE SODIUM 40 MG PO TBEC: 40.0000 mg | DELAYED_RELEASE_TABLET | Freq: Every day | ORAL | 3 refills | Status: DC

## 2016-05-06 MED FILL — PANTOPRAZOLE SOD DR 40 MG T: 40 | 90 days supply | Qty: 90 | Fill #0

## 2016-05-06 MED FILL — SILDENAFIL 20 MG TABLET: 20 | 6 days supply | Qty: 6 | Fill #0

## 2016-05-06 MED FILL — MOMETASONE FUROATE 50 MCG S: 50 | 30 days supply | Qty: 17 | Fill #4

## 2016-05-06 NOTE — Telephone Encounter (Signed)
Spoke with John Johnston and he said no he doesn't want the Nexium since it cost so much , he just wants a refill on his pantoprazole.  I called and cancelled the Nexium rx at [facility].  Refilled the pantoprazole.

## 2016-05-06 NOTE — Telephone Encounter (Signed)
Left message for patient to call me.  I spoke with the pharmacy and they said the Nexium will run $500.00.  The prior authorization doesn't look like it is going to be approved without him trying at least two different generic versions and failing both of those.

## 2016-07-01 DIAGNOSIS — N401 Enlarged prostate with lower urinary tract symptoms: Secondary | ICD-10-CM | POA: Diagnosis not present

## 2016-07-01 DIAGNOSIS — E291 Testicular hypofunction: Secondary | ICD-10-CM | POA: Diagnosis not present

## 2016-07-01 DIAGNOSIS — N3020 Other chronic cystitis without hematuria: Secondary | ICD-10-CM | POA: Diagnosis not present

## 2016-07-01 DIAGNOSIS — N538 Other male sexual dysfunction: Secondary | ICD-10-CM | POA: Diagnosis not present

## 2016-08-13 MED FILL — ANDROGEL 1.62% GEL PUMP: 20.25 MG/AC | 30 days supply | Qty: 75 | Fill #0

## 2016-08-23 MED FILL — MOMETASONE FUROATE 50 MCG S: 50 | 30 days supply | Qty: 17 | Fill #0

## 2016-08-23 MED FILL — PANTOPRAZOLE SOD DR 40 MG T: 40 | 90 days supply | Qty: 90 | Fill #1

## 2016-09-13 MED FILL — ANDROGEL 1.62% GEL PUMP: 20.25 MG/AC | 30 days supply | Qty: 75 | Fill #1

## 2016-10-13 MED FILL — MOMETASONE FUROATE 50 MCG S: 50 | 30 days supply | Qty: 17 | Fill #1

## 2016-10-13 MED FILL — ANDROGEL 1.62% GEL PUMP: 20.25 MG/AC | 30 days supply | Qty: 75 | Fill #2

## 2016-11-22 MED FILL — PANTOPRAZOLE SOD DR 40 MG T: 40 | 90 days supply | Qty: 90 | Fill #2

## 2016-11-22 MED FILL — MOMETASONE FUROATE 50 MCG S: 50 | 30 days supply | Qty: 17 | Fill #2

## 2016-11-22 MED FILL — ANDROGEL 1.62% GEL PUMP: 20.25 MG/AC | 30 days supply | Qty: 75 | Fill #3

## 2016-12-15 DIAGNOSIS — M25561 Pain in right knee: Secondary | ICD-10-CM | POA: Diagnosis not present

## 2016-12-15 DIAGNOSIS — M25562 Pain in left knee: Secondary | ICD-10-CM | POA: Diagnosis not present

## 2016-12-15 DIAGNOSIS — M170 Bilateral primary osteoarthritis of knee: Secondary | ICD-10-CM | POA: Diagnosis not present

## 2016-12-15 DIAGNOSIS — G8929 Other chronic pain: Secondary | ICD-10-CM | POA: Diagnosis not present

## 2016-12-27 MED FILL — ANDROGEL 1.62% GEL PUMP: 20.25 MG/AC | 30 days supply | Qty: 75 | Fill #4

## 2017-01-11 DIAGNOSIS — N538 Other male sexual dysfunction: Secondary | ICD-10-CM | POA: Diagnosis not present

## 2017-01-11 DIAGNOSIS — N3020 Other chronic cystitis without hematuria: Secondary | ICD-10-CM | POA: Diagnosis not present

## 2017-01-11 DIAGNOSIS — E291 Testicular hypofunction: Secondary | ICD-10-CM | POA: Diagnosis not present

## 2017-01-11 DIAGNOSIS — N318 Other neuromuscular dysfunction of bladder: Secondary | ICD-10-CM | POA: Diagnosis not present

## 2017-01-11 DIAGNOSIS — N401 Enlarged prostate with lower urinary tract symptoms: Secondary | ICD-10-CM | POA: Diagnosis not present

## 2017-01-19 MED FILL — MOMETASONE FUROATE 50 MCG S: 50 | 30 days supply | Qty: 17 | Fill #3

## 2017-01-28 DIAGNOSIS — M170 Bilateral primary osteoarthritis of knee: Secondary | ICD-10-CM | POA: Diagnosis not present

## 2017-02-07 DIAGNOSIS — M170 Bilateral primary osteoarthritis of knee: Secondary | ICD-10-CM | POA: Diagnosis not present

## 2017-02-16 DIAGNOSIS — M170 Bilateral primary osteoarthritis of knee: Secondary | ICD-10-CM | POA: Diagnosis not present

## 2017-02-16 MED FILL — TESTOSTERONE 20.25 MG/ACT (: 20.25 MG/AC | 30 days supply | Qty: 75 | Fill #0

## 2017-02-24 MED FILL — PANTOPRAZOLE SOD DR 40 MG T: 40 | 90 days supply | Qty: 90 | Fill #3

## 2017-03-10 IMAGING — MR MR CERVICAL SPINE W/O CM
4 of 5 series · 19 of 48 positions shown · non-contrast
Comparison: None.

CLINICAL DATA: Neck pain man bilateral arm pain.

EXAM:
MRI CERVICAL SPINE WITHOUT CONTRAST
TECHNIQUE: Multiplanar, multisequence MR imaging of the cervical spine was
performed. No intravenous contrast was administered.

[Series 4: T2 · sagittal · 3.0mm · 0.43mm/px · 5 of 15 slices shown (1 of 2)]
[im 1/15]
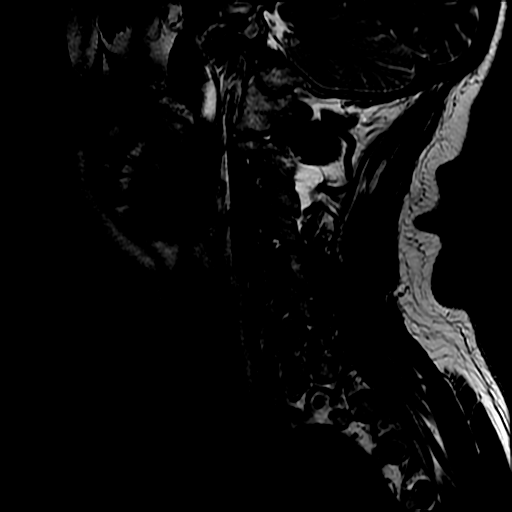
[im 4/15]
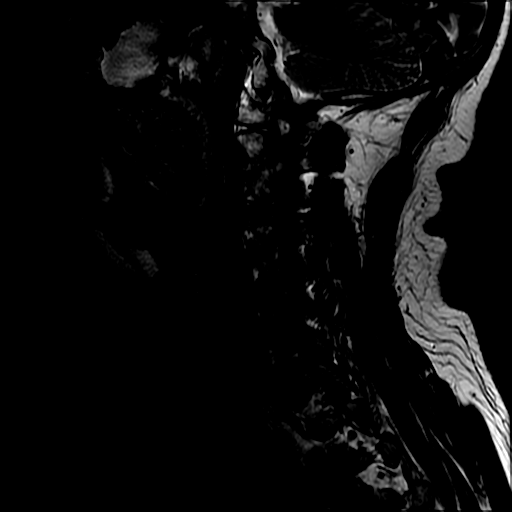
[im 8/15]
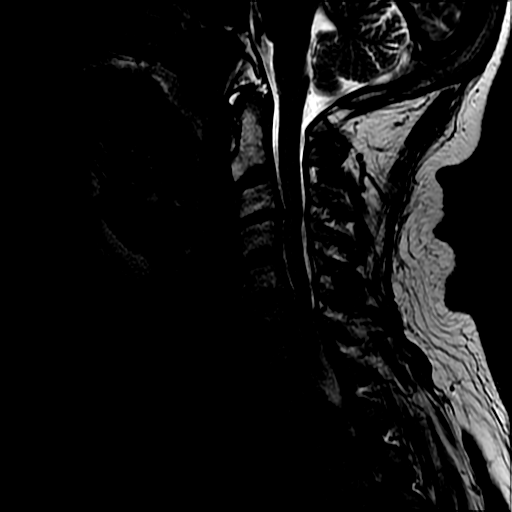
[im 11/15]
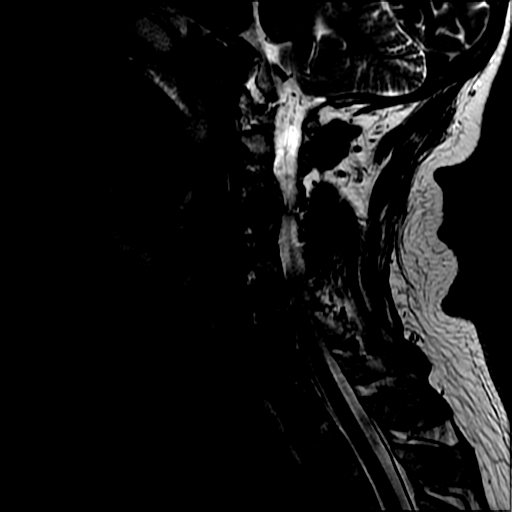
[im 15/15]
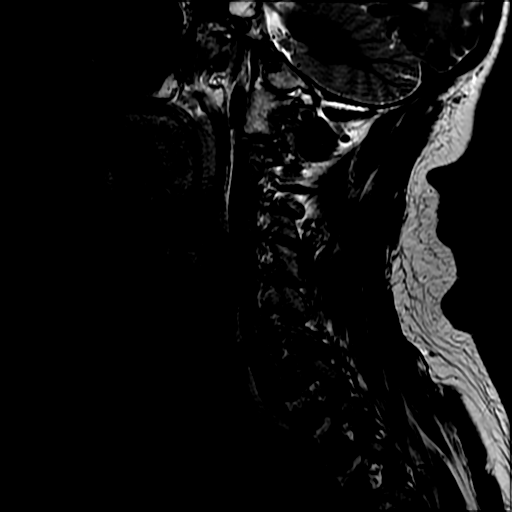

[Series 5: FLAIR · sagittal · 3.0mm · 0.43mm/px · 3 of 15 slices shown]
[im 3/15]
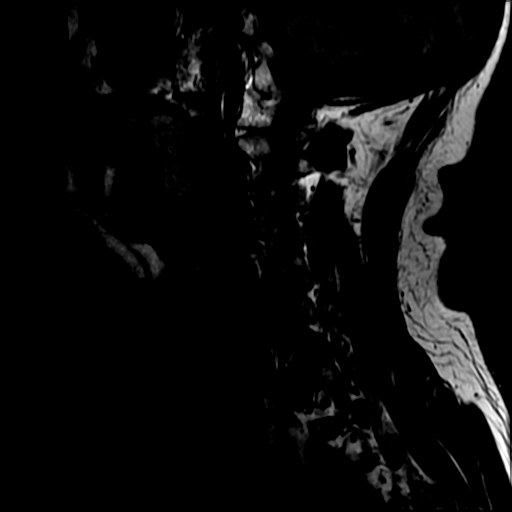
[im 9/15]
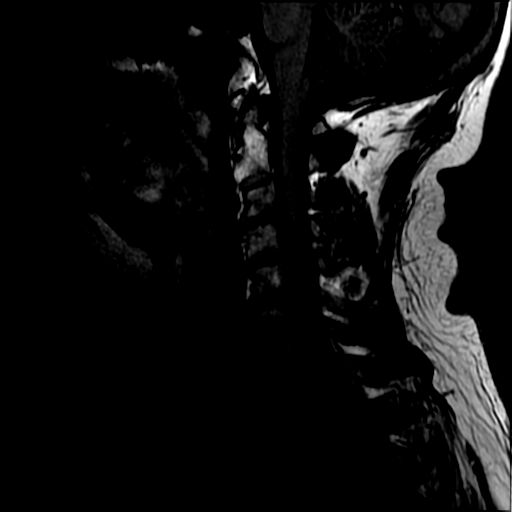
[im 15/15]
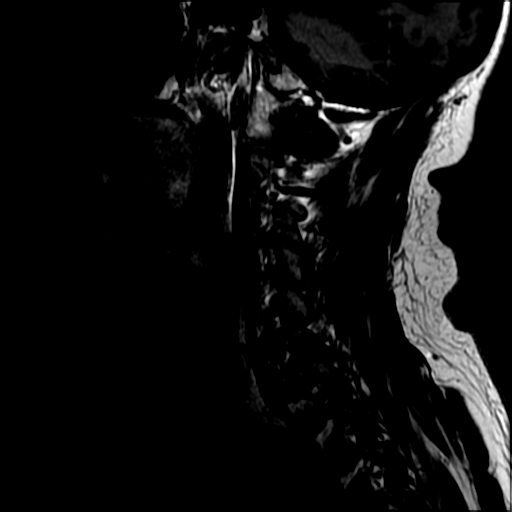

[Series 6: STIR · sagittal · 3.0mm · 0.43mm/px · 3 of 15 slices shown]
[im 3/15]
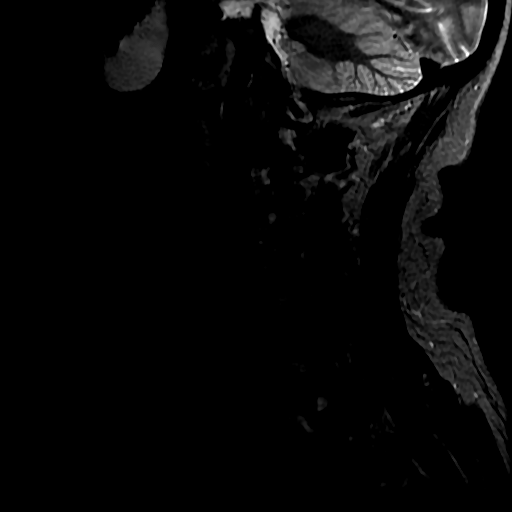
[im 9/15]
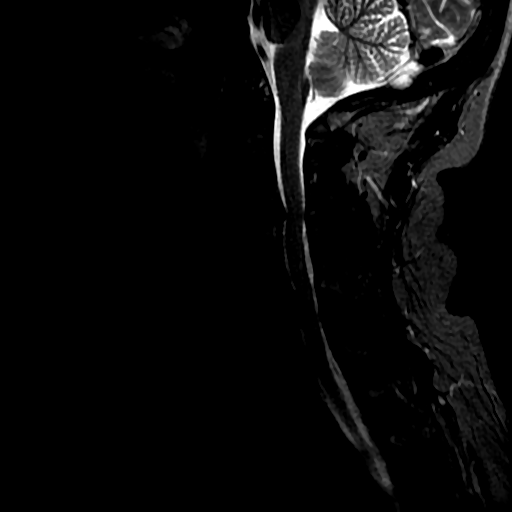
[im 15/15]
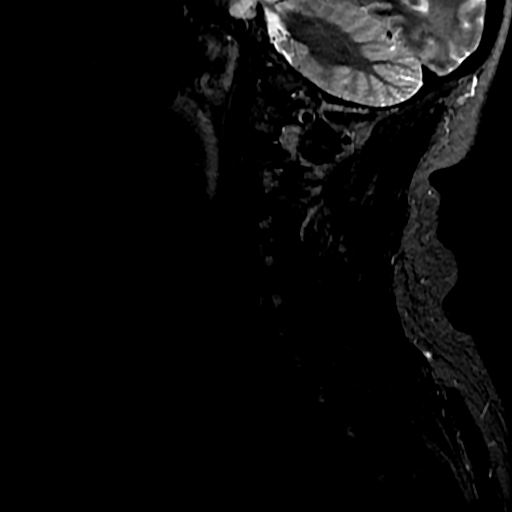

[Series 8: T2 · axial · 3.0mm · 0.35mm/px · z∈[+12,+122]mm · 8 of 35 slices shown (2 of 2)]
[im 1/35]
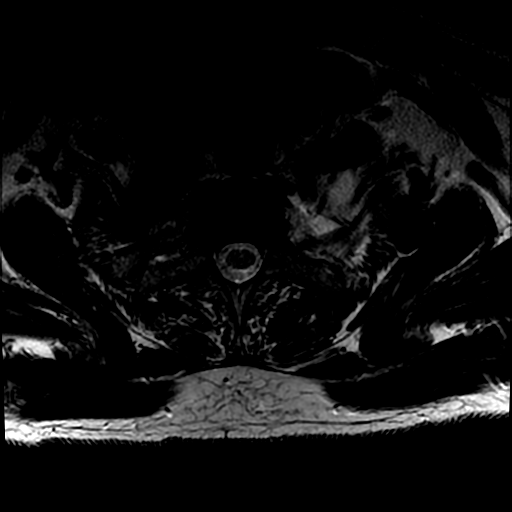
[im 6/35]
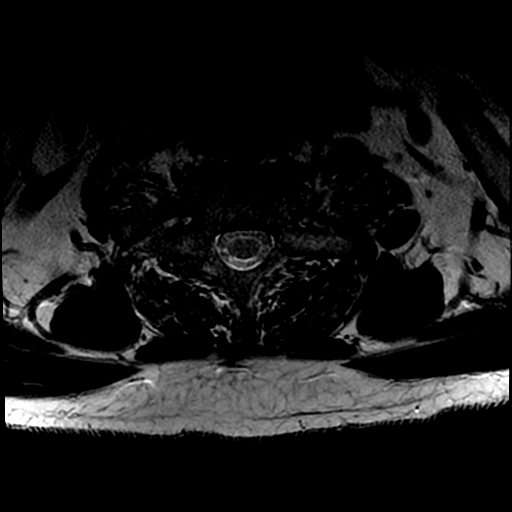
[im 11/35]
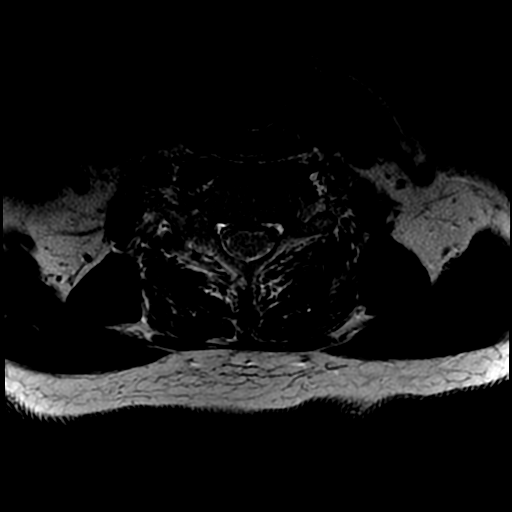
[im 16/35]
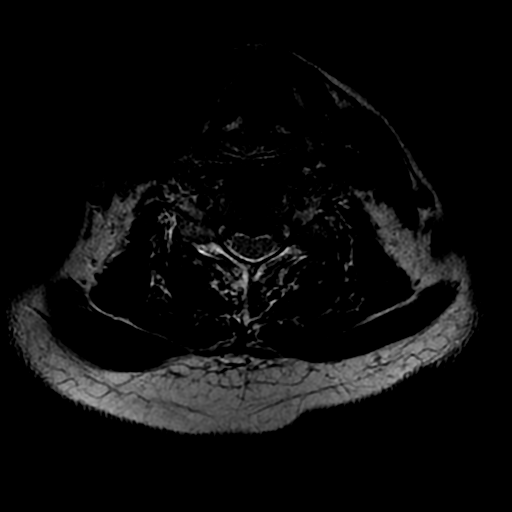
[im 19/35]
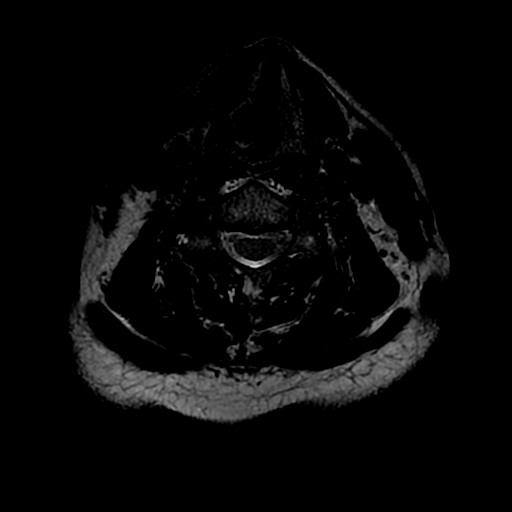
[im 24/35]
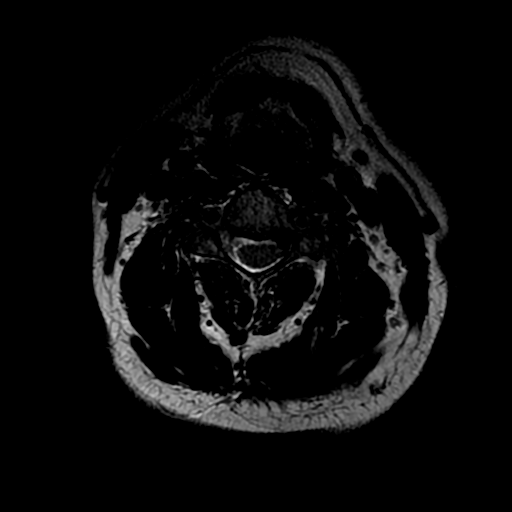
[im 29/35]
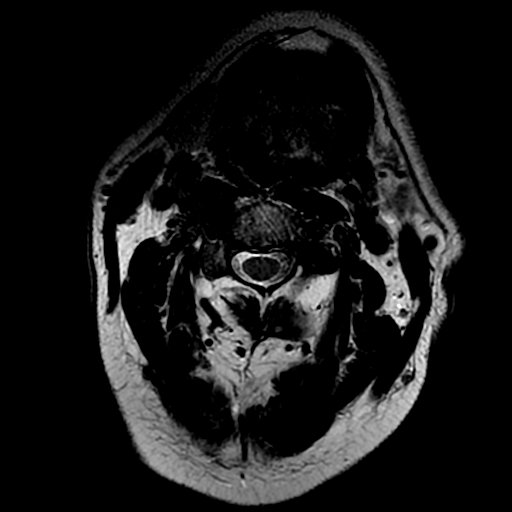
[im 35/35]
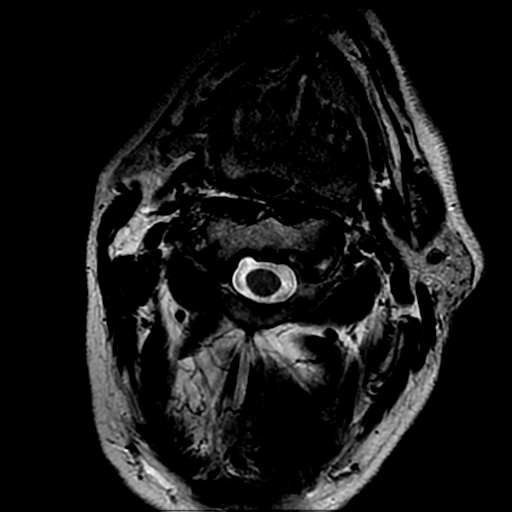

[19 of 48 positions shown; findings below may reference images not displayed]

FINDINGS: Alignment: Normal overall alignment. Mild degenerative posterior
subluxation of C3.

Vertebrae: Endplate reactive changes but no worrisome bone lesions
or acute fracture.

Cord: No cord lesions or syrinx.

Posterior Fossa, vertebral arteries, paraspinal tissues: No
significant findings.

Disc levels:

C2-3:  No significant findings.

C3-4: Bulging annulus and osteophytic ridging with flattening of the
ventral thecal sac and narrowing the ventral CSF space. There is
also uncinate spurring changes contributing to mild to moderate
bilateral foraminal stenosis.

C4-5:  No significant findings.  Mild facet disease.

C5-6: Degenerated bulging annulus, osteophytic ridging and uncinate
spurring with flattening of the ventral thecal sac and narrowing of
the ventral CSF space. There is bilateral foraminal stenosis which
is moderate.

C6-7: Degenerate disc disease with a bulging annulus and osteophytic
ridging. Mild facet disease. Mild bilateral foraminal stenosis.

C7-T1: Bulging annulus, facet disease and uncinate spurring with
mild bilateral foraminal encroachment. No significant spinal
stenosis.
IMPRESSION: 1. Degenerative cervical spondylosis with multilevel disc disease
and facet disease.
2. Mild spinal and mild to moderate bilateral foraminal stenosis at
C3-4.
3. Mild spinal and moderate bilateral foraminal stenosis at C5-6.
4. Mild bilateral foraminal stenosis at C6-7.

## 2017-03-23 MED FILL — TESTOSTERONE 20.25 MG/ACT (: 20.25 MG/AC | 30 days supply | Qty: 75 | Fill #1

## 2017-03-28 MED FILL — SILDENAFIL 20 MG TABLET: 20 | 6 days supply | Qty: 6 | Fill #1

## 2017-04-20 DIAGNOSIS — M7541 Impingement syndrome of right shoulder: Secondary | ICD-10-CM | POA: Diagnosis not present

## 2017-04-20 DIAGNOSIS — M25512 Pain in left shoulder: Secondary | ICD-10-CM | POA: Diagnosis not present

## 2017-04-20 DIAGNOSIS — M25511 Pain in right shoulder: Secondary | ICD-10-CM | POA: Diagnosis not present

## 2017-04-20 DIAGNOSIS — M7542 Impingement syndrome of left shoulder: Secondary | ICD-10-CM | POA: Diagnosis not present

## 2017-04-28 MED FILL — TESTOSTERONE 20.25 MG/ACT (: 20.25 MG/AC | 30 days supply | Qty: 75 | Fill #2

## 2017-04-28 MED FILL — MOMETASONE FUROATE 50 MCG S: 50 | 30 days supply | Qty: 17 | Fill #4

## 2017-05-23 ENCOUNTER — Other Ambulatory Visit: Payer: Self-pay | Admitting: Internal Medicine

## 2017-05-23 MED FILL — PANTOPRAZOLE SOD DR 40 MG T: 40 | 90 days supply | Qty: 90 | Fill #0

## 2017-05-23 MED FILL — MOMETASONE FUROATE 50 MCG S: 50 | 30 days supply | Qty: 17 | Fill #5

## 2017-05-26 MED FILL — TESTOSTERONE 20.25 MG/ACT (: 20.25 MG/AC | 30 days supply | Qty: 75 | Fill #3

## 2017-06-01 DIAGNOSIS — M7542 Impingement syndrome of left shoulder: Secondary | ICD-10-CM | POA: Diagnosis not present

## 2017-06-01 DIAGNOSIS — M25511 Pain in right shoulder: Secondary | ICD-10-CM | POA: Diagnosis not present

## 2017-06-01 DIAGNOSIS — M25512 Pain in left shoulder: Secondary | ICD-10-CM | POA: Diagnosis not present

## 2017-06-01 DIAGNOSIS — M7541 Impingement syndrome of right shoulder: Secondary | ICD-10-CM | POA: Diagnosis not present

## 2017-07-11 DIAGNOSIS — N401 Enlarged prostate with lower urinary tract symptoms: Secondary | ICD-10-CM | POA: Diagnosis not present

## 2017-07-11 DIAGNOSIS — N538 Other male sexual dysfunction: Secondary | ICD-10-CM | POA: Diagnosis not present

## 2017-07-11 DIAGNOSIS — N318 Other neuromuscular dysfunction of bladder: Secondary | ICD-10-CM | POA: Diagnosis not present

## 2017-07-11 DIAGNOSIS — N3020 Other chronic cystitis without hematuria: Secondary | ICD-10-CM | POA: Diagnosis not present

## 2017-07-11 DIAGNOSIS — E291 Testicular hypofunction: Secondary | ICD-10-CM | POA: Diagnosis not present

## 2017-07-18 ENCOUNTER — Other Ambulatory Visit: Payer: Self-pay | Admitting: Internal Medicine

## 2017-07-18 MED FILL — MOMETASONE FUROATE 50 MCG S: 50 | 30 days supply | Qty: 17 | Fill #6

## 2017-07-18 MED FILL — TESTOSTERONE 20.25 MG/ACT (: 20.25 MG/AC | 30 days supply | Qty: 75 | Fill #4

## 2017-08-23 DIAGNOSIS — M25522 Pain in left elbow: Secondary | ICD-10-CM | POA: Diagnosis not present

## 2017-08-23 DIAGNOSIS — M25521 Pain in right elbow: Secondary | ICD-10-CM | POA: Diagnosis not present

## 2017-08-23 DIAGNOSIS — M25531 Pain in right wrist: Secondary | ICD-10-CM | POA: Diagnosis not present

## 2017-08-23 DIAGNOSIS — M25532 Pain in left wrist: Secondary | ICD-10-CM | POA: Diagnosis not present

## 2017-08-26 MED FILL — TESTOSTERONE 20.25 MG/ACT (: 20.25 MG/AC | 30 days supply | Qty: 75 | Fill #0

## 2017-10-06 MED FILL — MOMETASONE FUROATE 50 MCG S: 50 | 30 days supply | Qty: 17 | Fill #0

## 2017-10-06 MED FILL — PANTOPRAZOLE SOD DR 40 MG T: 40 | 90 days supply | Qty: 90 | Fill #0

## 2017-10-06 MED FILL — TESTOSTERONE 20.25 MG/ACT (: 20.25 MG/AC | 30 days supply | Qty: 75 | Fill #1

## 2017-11-11 MED FILL — TESTOSTERONE 20.25 MG/ACT (: 20.25 MG/AC | 30 days supply | Qty: 75 | Fill #2

## 2017-11-18 ENCOUNTER — Ambulatory Visit: Payer: 59 | Admitting: Internal Medicine

## 2017-12-19 MED FILL — TESTOSTERONE 20.25 MG/ACT (: 20.25 MG/AC | 30 days supply | Qty: 75 | Fill #3

## 2018-01-27 MED FILL — MOMETASONE FUROATE 50 MCG S: 50 | 30 days supply | Qty: 17 | Fill #2

## 2018-01-27 MED FILL — TESTOSTERONE 20.25 MG/ACT (: 20.25 MG/AC | 30 days supply | Qty: 75 | Fill #4

## 2018-03-28 ENCOUNTER — Other Ambulatory Visit: Payer: Self-pay | Admitting: Internal Medicine

## 2018-04-07 MED FILL — PANTOPRAZOLE SOD DR 40 MG T: 40 | 90 days supply | Qty: 90 | Fill #0

## 2018-08-14 ENCOUNTER — Other Ambulatory Visit: Payer: Self-pay

## 2018-08-14 ENCOUNTER — Other Ambulatory Visit: Payer: Self-pay | Admitting: Internal Medicine

## 2018-08-14 MED ORDER — PANTOPRAZOLE SODIUM 40 MG PO TBEC: 40.0000 mg | DELAYED_RELEASE_TABLET | Freq: Every day | ORAL | 0 refills | Status: DC

## 2018-08-14 MED FILL — PANTOPRAZOLE SOD DR 40 MG T: 40 | 90 days supply | Qty: 90 | Fill #0

## 2018-08-14 NOTE — Telephone Encounter (Signed)
Pantoprazole refilled and note attached to call us to set up office visit.

## 2018-08-17 ENCOUNTER — Other Ambulatory Visit: Payer: Self-pay | Admitting: Internal Medicine

## 2018-11-21 ENCOUNTER — Telehealth: Payer: Self-pay | Admitting: Internal Medicine

## 2018-11-21 MED ORDER — PANTOPRAZOLE SODIUM 40 MG PO TBEC: 40.0000 mg | DELAYED_RELEASE_TABLET | Freq: Every day | ORAL | 1 refills | Status: DC

## 2018-11-21 MED FILL — PANTOPRAZOLE SOD DR 40 MG T: 40 | 90 days supply | Qty: 90 | Fill #0

## 2018-11-21 NOTE — Telephone Encounter (Signed)
Refill x 6 mos please  Tell him to call back if he needs me before insurance changes - I will do my best to help w/o visit if he needs it  [facility] he is doing ok after losing pam - I was aware that she passed and sorry for his loss.

## 2018-11-21 NOTE — Telephone Encounter (Signed)
John Johnston said his wife died last 02-26-2023 and he picked [employer] -local to be his insurance. He didn't realize his Dr.'s were not in network. Currently he has no PCP either. He is going to get new insurance in January 2021 so he can come see [place]. He is requesting a 90 day supply of his PPI to cover him in the meantime.

## 2018-11-21 NOTE — Telephone Encounter (Signed)
I passed the information on to [facility] and he said thank you for your care and concern.

## 2019-03-20 MED FILL — PANTOPRAZOLE SOD DR 40 MG T: 40 | 90 days supply | Qty: 90 | Fill #1

## 2019-03-23 ENCOUNTER — Encounter: Payer: Self-pay | Admitting: Internal Medicine

## 2019-04-24 ENCOUNTER — Ambulatory Visit: Payer: 59 | Admitting: Internal Medicine

## 2019-04-24 ENCOUNTER — Encounter: Payer: Self-pay | Admitting: Internal Medicine

## 2019-04-24 VITALS — BP 164/84 | HR 68 | Temp 97.8°F | Ht 67.75 in | Wt 166.5 lb

## 2019-04-24 DIAGNOSIS — K219 Gastro-esophageal reflux disease without esophagitis: Secondary | ICD-10-CM | POA: Diagnosis not present

## 2019-04-24 DIAGNOSIS — Z8719 Personal history of other diseases of the digestive system: Secondary | ICD-10-CM

## 2019-04-24 DIAGNOSIS — Z86010 Personal history of colonic polyps: Secondary | ICD-10-CM | POA: Diagnosis not present

## 2019-04-24 DIAGNOSIS — K224 Dyskinesia of esophagus: Secondary | ICD-10-CM | POA: Diagnosis not present

## 2019-04-24 DIAGNOSIS — R430 Anosmia: Secondary | ICD-10-CM

## 2019-04-24 DIAGNOSIS — Z860101 Personal history of adenomatous and serrated colon polyps: Secondary | ICD-10-CM

## 2019-04-24 MED ORDER — PANTOPRAZOLE SODIUM 40 MG PO TBEC: 40.0000 mg | DELAYED_RELEASE_TABLET | Freq: Every day | ORAL | 3 refills | Status: DC
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
## 2019-04-24 NOTE — Patient Instructions (Signed)
Try chewing 2 Altoid peppermints before you eat to see if that helps swallowing.  I have refilled the pantoprazole for 1 year - [employer].  Hope things work out at [employer]  I appreciate the opportunity to care for you. Gatha Mayer, MD, [facility]

## 2020-06-18 ENCOUNTER — Other Ambulatory Visit: Payer: Self-pay | Admitting: Internal Medicine

## 2020-11-05 ENCOUNTER — Telehealth: Payer: Self-pay | Admitting: Internal Medicine

## 2020-11-05 NOTE — Telephone Encounter (Signed)
Patient reports 10 days of abdominal pain.  Has gas and "swelling after a meal".  He has been drinking lots of "pink lemonade" . He will increase his pantoprazole to BID until OV with Ellouise Newer, PA on 11/18/20 1:30

## 2020-11-05 NOTE — Telephone Encounter (Signed)
Patient called states he is having a lot of abd pain and GI issues seeking advise

## 2020-11-10 ENCOUNTER — Encounter (HOSPITAL_COMMUNITY): Payer: Self-pay

## 2020-11-10 ENCOUNTER — Other Ambulatory Visit: Payer: Self-pay

## 2020-11-10 ENCOUNTER — Emergency Department (HOSPITAL_COMMUNITY)
Admission: EM | Admit: 2020-11-10 | Discharge: 2020-11-11 | Disposition: A | Discharge status: Home / Self Care | Payer: Commercial Managed Care - PPO | Attending: Emergency Medicine | Admitting: Emergency Medicine

## 2020-11-10 ENCOUNTER — Emergency Department (HOSPITAL_COMMUNITY): Payer: Commercial Managed Care - PPO

## 2020-11-10 DIAGNOSIS — K2970 Gastritis, unspecified, without bleeding: Secondary | ICD-10-CM | POA: Diagnosis not present

## 2020-11-10 DIAGNOSIS — Z87891 Personal history of nicotine dependence: Secondary | ICD-10-CM | POA: Diagnosis not present

## 2020-11-10 DIAGNOSIS — R1013 Epigastric pain: Secondary | ICD-10-CM | POA: Diagnosis present

## 2020-11-10 LAB — COMPREHENSIVE METABOLIC PANEL
ALT: 28 U/L (ref 0–44)
AST: 25 U/L (ref 15–41)
Albumin: 4.9 g/dL (ref 3.5–5.0)
Alkaline Phosphatase: 42 U/L (ref 38–126)
Anion gap: 12 (ref 5–15)
BUN: 16 mg/dL (ref 8–23)
CO2: 28 mmol/L (ref 22–32)
Calcium: 10 mg/dL (ref 8.9–10.3)
Chloride: 100 mmol/L (ref 98–111)
Creatinine, Ser: 0.99 mg/dL (ref 0.61–1.24)
GFR, Estimated: >60 mL/min (ref >60)
Glucose, Bld: 103 mg/dL — ABNORMAL HIGH (ref 70–99)
Potassium: 3.5 mmol/L (ref 3.5–5.1)
Sodium: 140 mmol/L (ref 135–145)
Total Bilirubin: 0.9 mg/dL (ref 0.3–1.2)
Total Protein: 8.4 g/dL — ABNORMAL HIGH (ref 6.5–8.1)

## 2020-11-10 LAB — CBC WITH DIFFERENTIAL/PLATELET
Abs Immature Granulocytes: 0.01 10*3/uL (ref 0.00–0.07)
Basophils Absolute: 0.1 10*3/uL (ref 0.0–0.1)
Basophils Relative: 1 %
Eosinophils Absolute: 0.1 10*3/uL (ref 0.0–0.5)
Eosinophils Relative: 1 %
HCT: 46.2 % (ref 39.0–52.0)
Hemoglobin: 16 g/dL (ref 13.0–17.0)
Immature Granulocytes: 0 %
Lymphocytes Relative: 18 %
Lymphs Abs: 1.2 10*3/uL (ref 0.7–4.0)
MCH: 30.3 pg (ref 26.0–34.0)
MCHC: 34.6 g/dL (ref 30.0–36.0)
MCV: 87.5 fL (ref 80.0–100.0)
Monocytes Absolute: 0.6 10*3/uL (ref 0.1–1.0)
Monocytes Relative: 8 %
Neutro Abs: 4.7 10*3/uL (ref 1.7–7.7)
Neutrophils Relative %: 72 %
Platelets: 268 10*3/uL (ref 150–400)
RBC: 5.28 MIL/uL (ref 4.22–5.81)
RDW: 12.2 % (ref 11.5–15.5)
WBC: 6.6 10*3/uL (ref 4.0–10.5)
nRBC: 0 % (ref 0.0–0.2)

## 2020-11-10 LAB — LIPASE, BLOOD: Lipase: 40 U/L (ref 11–51)

## 2020-11-10 LAB — TROPONIN I (HIGH SENSITIVITY)
Troponin I (High Sensitivity): 8 ng/L (ref <18)
Troponin I (High Sensitivity): 8 ng/L (ref <18)

## 2020-11-10 NOTE — ED Provider Notes (Signed)
Emergency Medicine Provider Triage Evaluation Note  John Johnston , a 65 y.o. male  was evaluated in triage.  Pt complains of epigastric abd pain and chest pain.  Review of Systems  Positive: Abd pain, chest pain Negative: nv  Physical Exam  BP (!) 174/105 (BP Location: Left Arm)   Pulse 92   Temp 98.1 F (36.7 C) (Oral)   Resp 16   Ht '5\' 9"'$  (1.753 m)   Wt 72.6 kg   SpO2 100%   BMI 23.63 kg/m  Gen:   Awake, no distress   Resp:  Normal effort  MSK:   Moves extremities without difficulty  Other:    Medical Decision Making  Medically screening exam initiated at 6:27 PM.  Appropriate orders placed.  John Johnston was informed that the remainder of the evaluation will be completed by another provider, this initial triage assessment does not replace that evaluation, and the importance of remaining in the ED until their evaluation is complete.     John Johnston 11/10/20 1827    Tegeler, Gwenyth Allegra, MD 11/11/20 [phone]

## 2020-11-10 NOTE — ED Triage Notes (Signed)
Patient reports that he began having upper abdominal pain x 5 days. Patient states that he drank a lot of pink lemonade in those 5 days. Patient states when he lays down at night he has a lot of pressure and gas that radiates into his chest and flank area.

## 2020-11-11 ENCOUNTER — Emergency Department (HOSPITAL_COMMUNITY): Payer: Commercial Managed Care - PPO

## 2020-11-11 ENCOUNTER — Encounter (HOSPITAL_COMMUNITY): Payer: Self-pay

## 2020-11-11 LAB — URINALYSIS, ROUTINE W REFLEX MICROSCOPIC
Bilirubin Urine: NEGATIVE
Glucose, UA: NEGATIVE mg/dL
Hgb urine dipstick: NEGATIVE
Ketones, ur: 5 mg/dL — AB
Leukocytes,Ua: NEGATIVE
Nitrite: NEGATIVE
Protein, ur: NEGATIVE mg/dL
Specific Gravity, Urine: 1.03 (ref 1.005–1.030)
pH: 5 (ref 5.0–8.0)

## 2020-11-11 MED ORDER — IOHEXOL 350 MG/ML SOLN
100.0000 mL | Freq: Once | INTRAVENOUS | Status: AC | PRN
  Administered 2020-11-11: 75 mL via INTRAVENOUS

## 2020-11-11 MED ORDER — FAMOTIDINE 20 MG PO TABS
20.0000 mg | ORAL_TABLET | Freq: Two times a day (BID) | ORAL | 0 refills | Status: DC
Start: 2020-11-11 — End: 2020-11-18

## 2020-11-11 MED ORDER — ALUM & MAG HYDROXIDE-SIMETH 200-200-20 MG/5ML PO SUSP
30.0000 mL | Freq: Once | ORAL | Status: AC
  Administered 2020-11-11: 30 mL via ORAL
  Filled 2020-11-11: qty 30

## 2020-11-11 MED ORDER — FAMOTIDINE 20 MG PO TABS
20.0000 mg | ORAL_TABLET | Freq: Once | ORAL | Status: AC
  Administered 2020-11-11: 20 mg via ORAL
  Filled 2020-11-11: qty 1

## 2020-11-11 MED ORDER — LIDOCAINE VISCOUS HCL 2 % MT SOLN
15.0000 mL | Freq: Once | OROMUCOSAL | Status: AC
  Administered 2020-11-11: 15 mL via ORAL
  Filled 2020-11-11: qty 15

## 2020-11-11 MED ORDER — ALUM & MAG HYDROXIDE-SIMETH 400-400-40 MG/5ML PO SUSP: 15.0000 mL | Freq: Four times a day (QID) | ORAL | 0 refills | Status: DC | PRN

## 2020-11-11 MED ORDER — LIDOCAINE VISCOUS HCL 2 % MT SOLN: 15.0000 mL | Freq: Four times a day (QID) | OROMUCOSAL | 0 refills | Status: AC | PRN

## 2020-11-11 MED ORDER — PANTOPRAZOLE SODIUM 40 MG PO TBEC
40.0000 mg | DELAYED_RELEASE_TABLET | Freq: Once | ORAL | Status: AC
  Administered 2020-11-11: 40 mg via ORAL
  Filled 2020-11-11: qty 1
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
## 2020-11-18 ENCOUNTER — Ambulatory Visit: Payer: Commercial Managed Care - PPO | Admitting: Physician Assistant

## 2020-11-18 ENCOUNTER — Encounter: Payer: Self-pay | Admitting: Physician Assistant

## 2020-11-18 VITALS — BP 160/80 | HR 82 | Ht 69.0 in | Wt 162.1 lb

## 2020-11-18 DIAGNOSIS — K760 Fatty (change of) liver, not elsewhere classified: Secondary | ICD-10-CM | POA: Diagnosis not present

## 2020-11-18 DIAGNOSIS — Z86010 Personal history of colonic polyps: Secondary | ICD-10-CM

## 2020-11-18 DIAGNOSIS — R1013 Epigastric pain: Secondary | ICD-10-CM

## 2020-11-18 DIAGNOSIS — K219 Gastro-esophageal reflux disease without esophagitis: Secondary | ICD-10-CM | POA: Diagnosis not present

## 2020-11-18 MED ORDER — PANTOPRAZOLE SODIUM 40 MG PO TBEC: 40.0000 mg | DELAYED_RELEASE_TABLET | Freq: Two times a day (BID) | ORAL | 2 refills | Status: DC

## 2020-11-18 MED ORDER — SUCRALFATE 1 GM/10ML PO SUSP: 1.0000 g | Freq: Four times a day (QID) | ORAL | 1 refills | Status: DC
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
## 2020-11-18 NOTE — Patient Instructions (Addendum)
We have sent the following medications to your pharmacy for you to pick up at your convenience: Pantoprazole 40 mg twice daily 30-60 minutes before breakfast and dinner.  Carafate 10 ml three times daily 30 minutes before meals.  You have been scheduled for an endoscopy and colonoscopy. Please follow the written instructions given to you at your visit today. Please pick up your prep supplies at the pharmacy within the next 1-3 days. If you use inhalers (even only as needed), please bring them with you on the day of your procedure.  If you are age 14 or older, your body mass index should be between 23-30. Your Body mass index is 23.94 kg/m. If this is out of the aforementioned range listed, please consider follow up with your Primary Care Provider.  If you are age 57 or younger, your body mass index should be between 19-25. Your Body mass index is 23.94 kg/m. If this is out of the aformentioned range listed, please consider follow up with your Primary Care Provider.   __________________________________________________________  The [facility] providers would like to encourage you to use [facility] to communicate with providers for non-urgent requests or questions.  Due to long hold times on the telephone, sending your provider a message by [facility] may be a faster and more efficient way to get a response.  Please allow 48 business hours for a response.  Please remember that this is for non-urgent requests.

## 2020-12-22 ENCOUNTER — Other Ambulatory Visit: Payer: Self-pay

## 2020-12-22 ENCOUNTER — Encounter: Payer: Self-pay | Admitting: Internal Medicine

## 2020-12-22 ENCOUNTER — Ambulatory Visit (AMBULATORY_SURGERY_CENTER): Payer: Commercial Managed Care - PPO | Admitting: Internal Medicine

## 2020-12-22 VITALS — BP 115/57 | HR 53 | Temp 98.2°F | Resp 14 | Ht 69.0 in | Wt 162.0 lb

## 2020-12-22 DIAGNOSIS — D125 Benign neoplasm of sigmoid colon: Secondary | ICD-10-CM | POA: Diagnosis not present

## 2020-12-22 DIAGNOSIS — D123 Benign neoplasm of transverse colon: Secondary | ICD-10-CM | POA: Diagnosis not present

## 2020-12-22 DIAGNOSIS — D124 Benign neoplasm of descending colon: Secondary | ICD-10-CM

## 2020-12-22 DIAGNOSIS — K317 Polyp of stomach and duodenum: Secondary | ICD-10-CM

## 2020-12-22 DIAGNOSIS — D128 Benign neoplasm of rectum: Secondary | ICD-10-CM

## 2020-12-22 DIAGNOSIS — Z86010 Personal history of colonic polyps: Secondary | ICD-10-CM

## 2020-12-22 DIAGNOSIS — R1013 Epigastric pain: Secondary | ICD-10-CM

## 2020-12-22 DIAGNOSIS — D129 Benign neoplasm of anus and anal canal: Secondary | ICD-10-CM | POA: Diagnosis not present

## 2020-12-22 DIAGNOSIS — K3189 Other diseases of stomach and duodenum: Secondary | ICD-10-CM | POA: Diagnosis not present

## 2020-12-22 MED ORDER — SODIUM CHLORIDE 0.9 % IV SOLN: 500.0000 mL | Freq: Once | INTRAVENOUS | Status: DC

## 2020-12-22 NOTE — Op Note (Signed)
[facility] Patient Name: John Johnston Procedure Date: 12/22/2020 1:36 PM MRN: [id number] Endoscopist: Gatha Mayer , MD Age: 65 Referring MD:  Date of Birth: 1955/10/10 Gender: Male Account #: [id number] Procedure:                Upper GI endoscopy Indications:              Epigastric abdominal pain Medicines:                Propofol per Anesthesia, Monitored Anesthesia Care Procedure:                Pre-Anesthesia Assessment:                           - Prior to the procedure, a History and Physical                            was performed, and patient medications and                            allergies were reviewed. The patient's tolerance of                            previous anesthesia was also reviewed. The risks                            and benefits of the procedure and the sedation                            options and risks were discussed with the patient.                            All questions were answered, and informed consent                            was obtained. Prior Anticoagulants: The patient has                            taken no previous anticoagulant or antiplatelet                            agents. ASA Grade Assessment: II - A patient with                            mild systemic disease. After reviewing the risks                            and benefits, the patient was deemed in                            satisfactory condition to undergo the procedure.                           After obtaining informed consent, the endoscope was  passed under direct vision. Throughout the                            procedure, the patient's blood pressure, pulse, and                            oxygen saturations were monitored continuously. The                            Endoscope was introduced through the mouth, and                            advanced to the second part of duodenum. The upper                            GI  endoscopy was accomplished without difficulty.                            The patient tolerated the procedure well. Scope In: Scope Out: Findings:                 The examined esophagus was moderately tortuous.                           The gastroesophageal flap valve was visualized                            endoscopically and classified as Hill Grade II                            (fold present, opens with respiration).                           Patchy mildly erythematous mucosa was found in the                            entire examined stomach. Biopsies were taken with a                            cold forceps for histology. Verification of patient                            identification for the specimen was done. Estimated                            blood loss was minimal.                           Multiple diminutive sessile polyps with no stigmata                            of recent bleeding were found in the gastric fundus                            and  in the gastric body. Biopsies were taken with a                            cold forceps for histology. Verification of patient                            identification for the specimen was done. Estimated                            blood loss was minimal.                           The examined duodenum was normal.                           The cardia and gastric fundus were normal on                            retroflexion. Complications:            No immediate complications. Estimated Blood Loss:     Estimated blood loss was minimal. Impression:               - Tortuous esophagus.                           - Gastroesophageal flap valve classified as Hill                            Grade II (fold present, opens with respiration).                           - Erythematous mucosa in the stomach. Biopsied.                           - Multiple gastric polyps. Biopsied.                           - Normal examined  duodenum. Recommendation:           - Patient has a contact number available for                            emergencies. The signs and symptoms of potential                            delayed complications were discussed with the                            patient. Return to normal activities tomorrow.                            Written discharge instructions were provided to the                            patient.                           -  Resume previous diet.                           - Continue present medications.                           - See the other procedure note for documentation of                            additional recommendations.                           - Await pathology results. Gatha Mayer, MD 12/22/2020 2:19:03 PM This report has been signed electronically.

## 2020-12-22 NOTE — Progress Notes (Signed)
Called to room to assist during endoscopic procedure.  Patient ID and intended procedure confirmed with present staff. Received instructions for my participation in the procedure from the performing physician.  

## 2020-12-22 NOTE — Op Note (Signed)
[facility] Patient Name: John Johnston Procedure Date: 12/22/2020 1:36 PM MRN: [id number] Endoscopist: Gatha Mayer , MD Age: 65 Referring MD:  Date of Birth: April 04, 1955 Gender: Male Account #: [id number] Procedure:                Colonoscopy Indications:              Surveillance: Personal history of colonic polyps                            (unknown histology) on last colonoscopy 5 years                            ago, Last colonoscopy: 2017 Medicines:                Propofol per Anesthesia, Monitored Anesthesia Care Procedure:                Pre-Anesthesia Assessment:                           - Prior to the procedure, a History and Physical                            was performed, and patient medications and                            allergies were reviewed. The patient's tolerance of                            previous anesthesia was also reviewed. The risks                            and benefits of the procedure and the sedation                            options and risks were discussed with the patient.                            All questions were answered, and informed consent                            was obtained. Prior Anticoagulants: The patient has                            taken no previous anticoagulant or antiplatelet                            agents. ASA Grade Assessment: II - A patient with                            mild systemic disease. After reviewing the risks                            and benefits, the patient was deemed in  satisfactory condition to undergo the procedure.                           - Prior to the procedure, a History and Physical                            was performed, and patient medications and                            allergies were reviewed. The patient's tolerance of                            previous anesthesia was also reviewed. The risks                            and benefits of the  procedure and the sedation                            options and risks were discussed with the patient.                            All questions were answered, and informed consent                            was obtained. Prior Anticoagulants: The patient has                            taken no previous anticoagulant or antiplatelet                            agents. ASA Grade Assessment: II - A patient with                            mild systemic disease. After reviewing the risks                            and benefits, the patient was deemed in                            satisfactory condition to undergo the procedure.                           After obtaining informed consent, the colonoscope                            was passed under direct vision. Throughout the                            procedure, the patient's blood pressure, pulse, and                            oxygen saturations were monitored continuously. The  Olympus CF-HQ190L ([id number]) Colonoscope was                            introduced through the anus and advanced to the the                            cecum, identified by appendiceal orifice and                            ileocecal valve. The colonoscopy was performed                            without difficulty. The patient tolerated the                            procedure well. The quality of the bowel                            preparation was good. The ileocecal valve,                            appendiceal orifice, and rectum were photographed.                            The bowel preparation used was Miralax via split                            dose instruction. Scope In: 1:52:55 PM Scope Out: 2:10:05 PM Scope Withdrawal Time: 0 hours 15 minutes 2 seconds  Total Procedure Duration: 0 hours 17 minutes 10 seconds  Findings:                 The perianal exam findings include non-thrombosed                            external  hemorrhoids and skin tags.                           Seven sessile polyps were found in the rectum,                            sigmoid colon, descending colon and transverse                            colon. The polyps were diminutive in size. These                            polyps were removed with a cold snare. Resection                            and retrieval were complete. Verification of                            patient identification for the specimen was done.  Estimated blood loss was minimal.                           Multiple diverticula were found in the sigmoid                            colon, descending colon, transverse colon and                            ascending colon.                           External and internal hemorrhoids were found.                           The exam was otherwise without abnormality on                            direct and retroflexion views. Complications:            No immediate complications. Estimated Blood Loss:     Estimated blood loss was minimal. Impression:               - Non-thrombosed external hemorrhoids and perianal                            skin tags found on perianal exam.                           - Seven diminutive polyps in the rectum, in the                            sigmoid colon, in the descending colon and in the                            transverse colon, removed with a cold snare.                            Resected and retrieved.                           - Diverticulosis in the sigmoid colon, in the                            descending colon, in the transverse colon and in                            the ascending colon.                           - External and internal hemorrhoids.                           - The examination was otherwise normal on direct  and retroflexion views. Recommendation:           - Patient has a contact number available for                             emergencies. The signs and symptoms of potential                            delayed complications were discussed with the                            patient. Return to normal activities tomorrow.                            Written discharge instructions were provided to the                            patient.                           - Resume previous diet.                           - Continue present medications.                           - Await pathology results.                           - Repeat colonoscopy is recommended. The                            colonoscopy date will be determined after pathology                            results from today's exam become available for                            review. Gatha Mayer, MD 12/22/2020 2:29:39 PM This report has been signed electronically.

## 2020-12-22 NOTE — Patient Instructions (Addendum)
I removed 7 tiny colon and rectal polyps.  The stomach lining was a bit red - may or may not indicate inflammation. Biopsies taken. There were some innocent-looking stomach polyps also - these were also biopsied.  We will contact you with results and plans.  I appreciate the opportunity to care for you. Gatha Mayer, MD, FACG   YOU HAD AN ENDOSCOPIC PROCEDURE TODAY AT [facility]:   Refer to the procedure report that was given to you for any specific questions about what was found during the examination.  If the procedure report does not answer your questions, please call your gastroenterologist to clarify.  If you requested that your care partner not be given the details of your procedure findings, then the procedure report has been included in a sealed envelope for you to review at your convenience later.  YOU SHOULD EXPECT: Some feelings of bloating in the abdomen. Passage of more gas than usual.  Walking can help get rid of the air that was put into your GI tract during the procedure and reduce the bloating. If you had a lower endoscopy (such as a colonoscopy or flexible sigmoidoscopy) you may notice spotting of blood in your stool or on the toilet paper. If you underwent a bowel prep for your procedure, you may not have a normal bowel movement for a few days.  Please Note:  You might notice some irritation and congestion in your nose or some drainage.  This is from the oxygen used during your procedure.  There is no need for concern and it should clear up in a day or so.  SYMPTOMS TO REPORT IMMEDIATELY:  Following lower endoscopy (colonoscopy or flexible sigmoidoscopy):  Excessive amounts of blood in the stool  Significant tenderness or worsening of abdominal pains  Swelling of the abdomen that is new, acute  Fever of 100F or higher  Following upper endoscopy (EGD)  Vomiting of blood or coffee ground material  New chest pain or pain under the shoulder  blades  Painful or persistently difficult swallowing  New shortness of breath  Fever of 100F or higher  Black, tarry-looking stools  For urgent or emergent issues, a gastroenterologist can be reached at any hour by calling [phone]. Do not use MyChart messaging for urgent concerns.    DIET:  We do recommend a small meal at first, but then you may proceed to your regular diet.  Drink plenty of fluids but you should avoid alcoholic beverages for 24 hours.  ACTIVITY:  You should plan to take it easy for the rest of today and you should NOT DRIVE or use heavy machinery until tomorrow (because of the sedation medicines used during the test).    FOLLOW UP: Our staff will call the number listed on your records 48-72 hours following your procedure to check on you and address any questions or concerns that you may have regarding the information given to you following your procedure. If we do not reach you, we will leave a message.  We will attempt to reach you two times.  During this call, we will ask if you have developed any symptoms of COVID 19. If you develop any symptoms (ie: fever, flu-like symptoms, shortness of breath, cough etc.) before then, please call [phone].  If you test positive for Covid 19 in the 2 weeks post procedure, please call and report this information to [place].    If any biopsies were taken you will be contacted by phone  or by letter within the next 1-3 weeks.  Please call us at [phone] if you have not heard about the biopsies in 3 weeks.    SIGNATURES/CONFIDENTIALITY: You and/or your care partner have signed paperwork which will be entered into your electronic medical record.  These signatures attest to the fact that that the information above on your After Visit Summary has been reviewed and is understood.  Full responsibility of the confidentiality of this discharge information lies with you and/or your care-partner.

## 2020-12-22 NOTE — Progress Notes (Signed)
Pt's states no medical or surgical changes since previsit or office visit.   JM IV and DT vitals.

## 2020-12-22 NOTE — Progress Notes (Signed)
History and Physical Interval Note:  12/22/2020 1:35 PM  John Johnston  has presented today for endoscopic procedure(s), with the diagnosis of  Encounter Diagnoses  Name Primary?   Abdominal pain, epigastric Yes   Personal history of colonic polyps   .  The various methods of evaluation and treatment have been discussed with the patient and/or family. After consideration of risks, benefits and other options for treatment, the patient has consented to  the endoscopic procedure(s).   The patient's history has been reviewed, patient examined, no change in status, stable for endoscopic procedure(s).  I have reviewed the patient's chart and labs.  Questions were answered to the patient's satisfaction.     Gatha Mayer, MD, [facility]

## 2020-12-22 NOTE — Progress Notes (Signed)
Report to PACU, RN, vss, BBS= Clear.  

## 2020-12-24 ENCOUNTER — Telehealth: Payer: Self-pay

## 2020-12-24 NOTE — Telephone Encounter (Signed)
  Follow up Call-  Call back number 12/22/2020  Post procedure Call Back phone  # [phone]  Permission to leave phone message Yes  Some recent data might be hidden     Patient questions:  Do you have a fever, pain , or abdominal swelling? No. Pain Score  0 *  Have you tolerated food without any problems? Yes.    Have you been able to return to your normal activities? Yes.    Do you have any questions about your discharge instructions: Diet   No. Medications  No. Follow up visit  No.  Do you have questions or concerns about your Care? No.  Actions: * If pain score is 4 or above: No action needed, pain <4.

## 2020-12-30 ENCOUNTER — Other Ambulatory Visit: Payer: Self-pay | Admitting: Internal Medicine

## 2020-12-30 MED ORDER — DICYCLOMINE HCL 20 MG PO TABS: 20.0000 mg | ORAL_TABLET | Freq: Three times a day (TID) | ORAL | 5 refills | Status: DC

## 2021-06-02 ENCOUNTER — Other Ambulatory Visit: Payer: Self-pay

## 2021-06-02 MED ORDER — PANTOPRAZOLE SODIUM 40 MG PO TBEC: 40.0000 mg | DELAYED_RELEASE_TABLET | Freq: Two times a day (BID) | ORAL | 2 refills | Status: DC

## 2021-12-29 ENCOUNTER — Other Ambulatory Visit: Payer: Self-pay | Admitting: Internal Medicine

## 2022-01-27 DIAGNOSIS — M545 Low back pain, unspecified: Secondary | ICD-10-CM | POA: Diagnosis not present

## 2022-01-27 DIAGNOSIS — M170 Bilateral primary osteoarthritis of knee: Secondary | ICD-10-CM | POA: Diagnosis not present

## 2022-02-08 ENCOUNTER — Other Ambulatory Visit (HOSPITAL_COMMUNITY): Payer: Self-pay

## 2022-02-08 DIAGNOSIS — M5136 Other intervertebral disc degeneration, lumbar region: Secondary | ICD-10-CM | POA: Diagnosis not present

## 2022-02-08 DIAGNOSIS — I739 Peripheral vascular disease, unspecified: Secondary | ICD-10-CM | POA: Diagnosis not present

## 2022-02-08 DIAGNOSIS — M5459 Other low back pain: Secondary | ICD-10-CM | POA: Diagnosis not present

## 2022-02-08 DIAGNOSIS — Z0189 Encounter for other specified special examinations: Secondary | ICD-10-CM | POA: Diagnosis not present

## 2022-02-08 DIAGNOSIS — M5451 Vertebrogenic low back pain: Secondary | ICD-10-CM | POA: Diagnosis not present

## 2022-02-08 MED ORDER — PREDNISONE 5 MG (21) PO TBPK
ORAL_TABLET | ORAL | 0 refills | Status: DC
  Filled 2022-02-08: qty 21, 6d supply, fill #0

## 2022-02-08 MED ORDER — LORAZEPAM 1 MG PO TABS
1.0000 mg | ORAL_TABLET | ORAL | 1 refills | Status: DC
  Filled 2022-02-08: qty 2, 1d supply, fill #0

## 2022-02-09 ENCOUNTER — Other Ambulatory Visit (HOSPITAL_COMMUNITY): Payer: Self-pay

## 2022-02-16 DIAGNOSIS — I10 Essential (primary) hypertension: Secondary | ICD-10-CM | POA: Diagnosis not present

## 2022-02-16 DIAGNOSIS — E785 Hyperlipidemia, unspecified: Secondary | ICD-10-CM | POA: Diagnosis not present

## 2022-02-16 DIAGNOSIS — R7303 Prediabetes: Secondary | ICD-10-CM | POA: Diagnosis not present

## 2022-02-17 DIAGNOSIS — M170 Bilateral primary osteoarthritis of knee: Secondary | ICD-10-CM | POA: Diagnosis not present

## 2022-02-19 ENCOUNTER — Other Ambulatory Visit (HOSPITAL_COMMUNITY): Payer: Self-pay

## 2022-02-22 DIAGNOSIS — Z6823 Body mass index (BMI) 23.0-23.9, adult: Secondary | ICD-10-CM | POA: Diagnosis not present

## 2022-02-22 DIAGNOSIS — R7303 Prediabetes: Secondary | ICD-10-CM | POA: Diagnosis not present

## 2022-02-22 DIAGNOSIS — I714 Abdominal aortic aneurysm, without rupture, unspecified: Secondary | ICD-10-CM | POA: Diagnosis not present

## 2022-02-22 DIAGNOSIS — I10 Essential (primary) hypertension: Secondary | ICD-10-CM | POA: Diagnosis not present

## 2022-02-22 DIAGNOSIS — Z139 Encounter for screening, unspecified: Secondary | ICD-10-CM | POA: Diagnosis not present

## 2022-02-22 DIAGNOSIS — E785 Hyperlipidemia, unspecified: Secondary | ICD-10-CM | POA: Diagnosis not present

## 2022-02-22 DIAGNOSIS — Z1331 Encounter for screening for depression: Secondary | ICD-10-CM | POA: Diagnosis not present

## 2022-02-24 DIAGNOSIS — M170 Bilateral primary osteoarthritis of knee: Secondary | ICD-10-CM | POA: Diagnosis not present

## 2022-02-26 DIAGNOSIS — M5451 Vertebrogenic low back pain: Secondary | ICD-10-CM | POA: Diagnosis not present

## 2022-03-01 DIAGNOSIS — I714 Abdominal aortic aneurysm, without rupture, unspecified: Secondary | ICD-10-CM | POA: Diagnosis not present

## 2022-03-03 DIAGNOSIS — M1712 Unilateral primary osteoarthritis, left knee: Secondary | ICD-10-CM | POA: Diagnosis not present

## 2022-03-03 DIAGNOSIS — M170 Bilateral primary osteoarthritis of knee: Secondary | ICD-10-CM | POA: Diagnosis not present

## 2022-03-03 DIAGNOSIS — M1711 Unilateral primary osteoarthritis, right knee: Secondary | ICD-10-CM | POA: Diagnosis not present

## 2022-03-05 DIAGNOSIS — M5451 Vertebrogenic low back pain: Secondary | ICD-10-CM | POA: Diagnosis not present

## 2022-03-18 DIAGNOSIS — M5416 Radiculopathy, lumbar region: Secondary | ICD-10-CM | POA: Diagnosis not present

## 2022-04-07 DIAGNOSIS — Z6823 Body mass index (BMI) 23.0-23.9, adult: Secondary | ICD-10-CM | POA: Diagnosis not present

## 2022-04-07 DIAGNOSIS — I10 Essential (primary) hypertension: Secondary | ICD-10-CM | POA: Diagnosis not present

## 2022-04-07 DIAGNOSIS — R936 Abnormal findings on diagnostic imaging of limbs: Secondary | ICD-10-CM | POA: Diagnosis not present

## 2022-04-16 DIAGNOSIS — M170 Bilateral primary osteoarthritis of knee: Secondary | ICD-10-CM | POA: Diagnosis not present

## 2022-04-20 DIAGNOSIS — I714 Abdominal aortic aneurysm, without rupture, unspecified: Secondary | ICD-10-CM | POA: Diagnosis not present

## 2022-04-20 DIAGNOSIS — R936 Abnormal findings on diagnostic imaging of limbs: Secondary | ICD-10-CM | POA: Diagnosis not present

## 2022-04-20 DIAGNOSIS — K551 Chronic vascular disorders of intestine: Secondary | ICD-10-CM | POA: Diagnosis not present

## 2022-04-20 DIAGNOSIS — I779 Disorder of arteries and arterioles, unspecified: Secondary | ICD-10-CM | POA: Diagnosis not present

## 2022-04-20 DIAGNOSIS — K5730 Diverticulosis of large intestine without perforation or abscess without bleeding: Secondary | ICD-10-CM | POA: Diagnosis not present

## 2022-05-04 DIAGNOSIS — M25562 Pain in left knee: Secondary | ICD-10-CM | POA: Diagnosis not present

## 2022-05-21 DIAGNOSIS — S83242A Other tear of medial meniscus, current injury, left knee, initial encounter: Secondary | ICD-10-CM | POA: Diagnosis not present

## 2022-05-21 DIAGNOSIS — M1712 Unilateral primary osteoarthritis, left knee: Secondary | ICD-10-CM | POA: Diagnosis not present

## 2022-06-29 DIAGNOSIS — M23322 Other meniscus derangements, posterior horn of medial meniscus, left knee: Secondary | ICD-10-CM | POA: Diagnosis not present

## 2022-06-29 DIAGNOSIS — M23222 Derangement of posterior horn of medial meniscus due to old tear or injury, left knee: Secondary | ICD-10-CM | POA: Diagnosis not present

## 2022-06-29 DIAGNOSIS — G8918 Other acute postprocedural pain: Secondary | ICD-10-CM | POA: Diagnosis not present

## 2022-07-29 DIAGNOSIS — N400 Enlarged prostate without lower urinary tract symptoms: Secondary | ICD-10-CM | POA: Diagnosis not present

## 2022-07-29 DIAGNOSIS — N481 Balanitis: Secondary | ICD-10-CM | POA: Diagnosis not present

## 2022-07-29 DIAGNOSIS — E291 Testicular hypofunction: Secondary | ICD-10-CM | POA: Diagnosis not present

## 2022-07-29 DIAGNOSIS — N5201 Erectile dysfunction due to arterial insufficiency: Secondary | ICD-10-CM | POA: Diagnosis not present

## 2022-08-13 ENCOUNTER — Other Ambulatory Visit: Payer: Self-pay

## 2022-08-13 MED ORDER — PANTOPRAZOLE SODIUM 40 MG PO TBEC: DELAYED_RELEASE_TABLET | ORAL | 2 refills | Status: DC

## 2022-08-13 NOTE — Telephone Encounter (Signed)
Pantoprazole refilled as pharmacy requested. 

## 2022-08-17 DIAGNOSIS — M25562 Pain in left knee: Secondary | ICD-10-CM | POA: Diagnosis not present

## 2022-08-17 DIAGNOSIS — Z5189 Encounter for other specified aftercare: Secondary | ICD-10-CM | POA: Diagnosis not present

## 2022-08-30 DIAGNOSIS — I10 Essential (primary) hypertension: Secondary | ICD-10-CM | POA: Diagnosis not present

## 2022-08-30 DIAGNOSIS — R7303 Prediabetes: Secondary | ICD-10-CM | POA: Diagnosis not present

## 2022-08-30 DIAGNOSIS — Z125 Encounter for screening for malignant neoplasm of prostate: Secondary | ICD-10-CM | POA: Diagnosis not present

## 2022-08-30 DIAGNOSIS — E785 Hyperlipidemia, unspecified: Secondary | ICD-10-CM | POA: Diagnosis not present

## 2022-09-06 DIAGNOSIS — E785 Hyperlipidemia, unspecified: Secondary | ICD-10-CM | POA: Diagnosis not present

## 2022-09-06 DIAGNOSIS — I10 Essential (primary) hypertension: Secondary | ICD-10-CM | POA: Diagnosis not present

## 2022-09-06 DIAGNOSIS — R7303 Prediabetes: Secondary | ICD-10-CM | POA: Diagnosis not present

## 2022-09-06 DIAGNOSIS — Z6824 Body mass index (BMI) 24.0-24.9, adult: Secondary | ICD-10-CM | POA: Diagnosis not present

## 2022-10-19 DIAGNOSIS — R948 Abnormal results of function studies of other organs and systems: Secondary | ICD-10-CM | POA: Diagnosis not present

## 2022-10-19 DIAGNOSIS — E291 Testicular hypofunction: Secondary | ICD-10-CM | POA: Diagnosis not present

## 2022-10-26 DIAGNOSIS — N400 Enlarged prostate without lower urinary tract symptoms: Secondary | ICD-10-CM | POA: Diagnosis not present

## 2022-10-26 DIAGNOSIS — N5201 Erectile dysfunction due to arterial insufficiency: Secondary | ICD-10-CM | POA: Diagnosis not present

## 2022-10-26 DIAGNOSIS — E291 Testicular hypofunction: Secondary | ICD-10-CM | POA: Diagnosis not present

## 2022-10-30 DIAGNOSIS — N481 Balanitis: Secondary | ICD-10-CM | POA: Diagnosis not present

## 2022-10-30 DIAGNOSIS — L299 Pruritus, unspecified: Secondary | ICD-10-CM | POA: Diagnosis not present

## 2022-11-20 DIAGNOSIS — L299 Pruritus, unspecified: Secondary | ICD-10-CM | POA: Diagnosis not present

## 2022-12-13 DIAGNOSIS — Z6824 Body mass index (BMI) 24.0-24.9, adult: Secondary | ICD-10-CM | POA: Diagnosis not present

## 2022-12-13 DIAGNOSIS — Z79899 Other long term (current) drug therapy: Secondary | ICD-10-CM | POA: Diagnosis not present

## 2022-12-13 DIAGNOSIS — L299 Pruritus, unspecified: Secondary | ICD-10-CM | POA: Diagnosis not present

## 2022-12-14 DIAGNOSIS — L299 Pruritus, unspecified: Secondary | ICD-10-CM | POA: Diagnosis not present

## 2023-01-19 DIAGNOSIS — E291 Testicular hypofunction: Secondary | ICD-10-CM | POA: Diagnosis not present

## 2023-01-19 DIAGNOSIS — R948 Abnormal results of function studies of other organs and systems: Secondary | ICD-10-CM | POA: Diagnosis not present

## 2023-01-26 DIAGNOSIS — E291 Testicular hypofunction: Secondary | ICD-10-CM | POA: Diagnosis not present

## 2023-01-26 DIAGNOSIS — N5201 Erectile dysfunction due to arterial insufficiency: Secondary | ICD-10-CM | POA: Diagnosis not present

## 2023-02-22 ENCOUNTER — Other Ambulatory Visit: Payer: Self-pay | Admitting: Internal Medicine

## 2023-02-25 DIAGNOSIS — E785 Hyperlipidemia, unspecified: Secondary | ICD-10-CM | POA: Diagnosis not present

## 2023-02-25 DIAGNOSIS — I10 Essential (primary) hypertension: Secondary | ICD-10-CM | POA: Diagnosis not present

## 2023-02-25 DIAGNOSIS — R7303 Prediabetes: Secondary | ICD-10-CM | POA: Diagnosis not present

## 2023-03-14 DIAGNOSIS — I10 Essential (primary) hypertension: Secondary | ICD-10-CM | POA: Diagnosis not present

## 2023-03-14 DIAGNOSIS — Z6825 Body mass index (BMI) 25.0-25.9, adult: Secondary | ICD-10-CM | POA: Diagnosis not present

## 2023-03-14 DIAGNOSIS — R7303 Prediabetes: Secondary | ICD-10-CM | POA: Diagnosis not present

## 2023-03-14 DIAGNOSIS — E785 Hyperlipidemia, unspecified: Secondary | ICD-10-CM | POA: Diagnosis not present

## 2023-04-14 DIAGNOSIS — M1611 Unilateral primary osteoarthritis, right hip: Secondary | ICD-10-CM | POA: Diagnosis not present

## 2023-04-15 DIAGNOSIS — M1611 Unilateral primary osteoarthritis, right hip: Secondary | ICD-10-CM | POA: Diagnosis not present

## 2023-04-25 ENCOUNTER — Telehealth: Payer: Self-pay | Admitting: Internal Medicine

## 2023-04-25 MED ORDER — PANTOPRAZOLE SODIUM 40 MG PO TBEC: DELAYED_RELEASE_TABLET | ORAL | 2 refills | Status: DC

## 2023-04-25 NOTE — Telephone Encounter (Signed)
 PT called to get a refill for pantoprazole  and was told by his pharmacist he needs to be seen to get a proper refill. He wants to know if Dr. Willy Harvest wants him to continue this medication because he's not understanding why he has to come in. Please advise.

## 2023-04-25 NOTE — Telephone Encounter (Signed)
 I spoke with John Johnston and told him the last time he was seen was Oct 2022 so we need him to come in. He understood and booked an April appointment. I sent in the pantoprazole  to cover until his appointment.

## 2023-07-11 ENCOUNTER — Ambulatory Visit: Payer: Commercial Managed Care - PPO | Admitting: Internal Medicine

## 2023-07-11 ENCOUNTER — Encounter: Payer: Self-pay | Admitting: Internal Medicine

## 2023-07-11 VITALS — BP 164/68 | HR 84 | Ht 69.0 in | Wt 166.0 lb

## 2023-07-11 DIAGNOSIS — K219 Gastro-esophageal reflux disease without esophagitis: Secondary | ICD-10-CM | POA: Diagnosis not present

## 2023-07-11 DIAGNOSIS — K224 Dyskinesia of esophagus: Secondary | ICD-10-CM | POA: Diagnosis not present

## 2023-07-11 DIAGNOSIS — Z860101 Personal history of adenomatous and serrated colon polyps: Secondary | ICD-10-CM

## 2023-07-11 MED ORDER — PANTOPRAZOLE SODIUM 40 MG PO TBEC: 40.0000 mg | DELAYED_RELEASE_TABLET | Freq: Every day | ORAL | 3 refills | Status: AC
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
## 2023-07-11 NOTE — Patient Instructions (Signed)
 We have sent the following medications to your pharmacy for you to pick up at your convenience: Pantoprazole    Follow up with your PCP about your musculoskeletal chest pain.   We have you in our system for an October 2025 colonoscopy recall.   I appreciate the opportunity to care for you. Loy Ruff, MD, [facility]

## 2023-07-19 DIAGNOSIS — E291 Testicular hypofunction: Secondary | ICD-10-CM | POA: Diagnosis not present

## 2023-07-27 DIAGNOSIS — E291 Testicular hypofunction: Secondary | ICD-10-CM | POA: Diagnosis not present

## 2023-07-27 DIAGNOSIS — N5201 Erectile dysfunction due to arterial insufficiency: Secondary | ICD-10-CM | POA: Diagnosis not present

## 2023-09-29 DIAGNOSIS — I10 Essential (primary) hypertension: Secondary | ICD-10-CM | POA: Diagnosis not present

## 2023-09-29 DIAGNOSIS — E785 Hyperlipidemia, unspecified: Secondary | ICD-10-CM | POA: Diagnosis not present

## 2023-09-29 DIAGNOSIS — R7303 Prediabetes: Secondary | ICD-10-CM | POA: Diagnosis not present

## 2023-10-05 DIAGNOSIS — I10 Essential (primary) hypertension: Secondary | ICD-10-CM | POA: Diagnosis not present

## 2023-10-05 DIAGNOSIS — Z1389 Encounter for screening for other disorder: Secondary | ICD-10-CM | POA: Diagnosis not present

## 2023-10-05 DIAGNOSIS — Z1339 Encounter for screening examination for other mental health and behavioral disorders: Secondary | ICD-10-CM | POA: Diagnosis not present

## 2023-10-05 DIAGNOSIS — Z1331 Encounter for screening for depression: Secondary | ICD-10-CM | POA: Diagnosis not present

## 2023-10-05 DIAGNOSIS — Z136 Encounter for screening for cardiovascular disorders: Secondary | ICD-10-CM | POA: Diagnosis not present

## 2023-10-05 DIAGNOSIS — Z139 Encounter for screening, unspecified: Secondary | ICD-10-CM | POA: Diagnosis not present

## 2023-10-05 DIAGNOSIS — E785 Hyperlipidemia, unspecified: Secondary | ICD-10-CM | POA: Diagnosis not present

## 2023-10-05 DIAGNOSIS — Z0000 Encounter for general adult medical examination without abnormal findings: Secondary | ICD-10-CM | POA: Diagnosis not present

## 2023-10-05 DIAGNOSIS — R7303 Prediabetes: Secondary | ICD-10-CM | POA: Diagnosis not present

## 2023-10-05 DIAGNOSIS — Z6824 Body mass index (BMI) 24.0-24.9, adult: Secondary | ICD-10-CM | POA: Diagnosis not present

## 2023-10-19 ENCOUNTER — Other Ambulatory Visit (HOSPITAL_BASED_OUTPATIENT_CLINIC_OR_DEPARTMENT_OTHER): Payer: Self-pay

## 2023-10-19 ENCOUNTER — Encounter (HOSPITAL_BASED_OUTPATIENT_CLINIC_OR_DEPARTMENT_OTHER): Payer: Self-pay

## 2023-10-19 ENCOUNTER — Ambulatory Visit (HOSPITAL_BASED_OUTPATIENT_CLINIC_OR_DEPARTMENT_OTHER): Admission: EM | Admit: 2023-10-19 | Discharge: 2023-10-19 | Disposition: A | Discharge status: Home / Self Care

## 2023-10-19 DIAGNOSIS — M25532 Pain in left wrist: Secondary | ICD-10-CM

## 2023-10-19 MED ORDER — TRIAMCINOLONE ACETONIDE 40 MG/ML IJ SUSP
40.0000 mg | Freq: Once | INTRAMUSCULAR | Status: AC
  Administered 2023-10-19: 40 mg via INTRAMUSCULAR

## 2023-10-19 MED ORDER — TRAMADOL HCL 50 MG PO TABS
50.0000 mg | ORAL_TABLET | Freq: Four times a day (QID) | ORAL | 0 refills | Status: DC | PRN
  Filled 2023-10-19: qty 15, 4d supply, fill #0

## 2023-10-19 NOTE — Discharge Instructions (Signed)
 We gave you a steroid shot here for pain.  I am also sending in some pain reliever to use as needed short-term.  If the pain persists or worsens you need to follow-up with orthopedic.
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
## 2023-10-19 NOTE — ED Triage Notes (Signed)
 Pt c/o sharp left wrist pain that started yesterday. Pt denies known injury. The pain was sudden and has gotten worst since then. He has taken tylenol  for the pain with some relief. He is unable to bend the wrist or gripping anything.

## 2023-10-20 DIAGNOSIS — M25532 Pain in left wrist: Secondary | ICD-10-CM | POA: Diagnosis not present

## 2023-10-24 DIAGNOSIS — E291 Testicular hypofunction: Secondary | ICD-10-CM | POA: Diagnosis not present

## 2023-10-31 DIAGNOSIS — R972 Elevated prostate specific antigen [PSA]: Secondary | ICD-10-CM | POA: Diagnosis not present

## 2023-10-31 DIAGNOSIS — N5201 Erectile dysfunction due to arterial insufficiency: Secondary | ICD-10-CM | POA: Diagnosis not present

## 2023-10-31 DIAGNOSIS — E291 Testicular hypofunction: Secondary | ICD-10-CM | POA: Diagnosis not present

## 2023-11-03 DIAGNOSIS — N453 Epididymo-orchitis: Secondary | ICD-10-CM | POA: Diagnosis not present

## 2023-12-15 DIAGNOSIS — H2513 Age-related nuclear cataract, bilateral: Secondary | ICD-10-CM | POA: Diagnosis not present

## 2023-12-15 DIAGNOSIS — H02834 Dermatochalasis of left upper eyelid: Secondary | ICD-10-CM | POA: Diagnosis not present

## 2023-12-15 DIAGNOSIS — H02831 Dermatochalasis of right upper eyelid: Secondary | ICD-10-CM | POA: Diagnosis not present

## 2023-12-15 DIAGNOSIS — H43813 Vitreous degeneration, bilateral: Secondary | ICD-10-CM | POA: Diagnosis not present

## 2023-12-19 DIAGNOSIS — R399 Unspecified symptoms and signs involving the genitourinary system: Secondary | ICD-10-CM | POA: Diagnosis not present

## 2023-12-19 DIAGNOSIS — N453 Epididymo-orchitis: Secondary | ICD-10-CM | POA: Diagnosis not present

## 2024-01-09 DIAGNOSIS — H81391 Other peripheral vertigo, right ear: Secondary | ICD-10-CM | POA: Diagnosis not present

## 2024-01-09 DIAGNOSIS — R9431 Abnormal electrocardiogram [ECG] [EKG]: Secondary | ICD-10-CM | POA: Diagnosis not present

## 2024-01-09 DIAGNOSIS — R29818 Other symptoms and signs involving the nervous system: Secondary | ICD-10-CM | POA: Diagnosis not present

## 2024-01-09 DIAGNOSIS — R42 Dizziness and giddiness: Secondary | ICD-10-CM | POA: Diagnosis not present

## 2024-01-09 DIAGNOSIS — Z79899 Other long term (current) drug therapy: Secondary | ICD-10-CM | POA: Diagnosis not present

## 2024-01-09 DIAGNOSIS — I491 Atrial premature depolarization: Secondary | ICD-10-CM | POA: Diagnosis not present

## 2024-01-09 DIAGNOSIS — I10 Essential (primary) hypertension: Secondary | ICD-10-CM | POA: Diagnosis not present

## 2024-01-23 DIAGNOSIS — E291 Testicular hypofunction: Secondary | ICD-10-CM | POA: Diagnosis not present

## 2024-01-23 DIAGNOSIS — R972 Elevated prostate specific antigen [PSA]: Secondary | ICD-10-CM | POA: Diagnosis not present

## 2024-01-30 DIAGNOSIS — N5201 Erectile dysfunction due to arterial insufficiency: Secondary | ICD-10-CM | POA: Diagnosis not present

## 2024-01-30 DIAGNOSIS — E291 Testicular hypofunction: Secondary | ICD-10-CM | POA: Diagnosis not present

## 2024-01-30 DIAGNOSIS — N453 Epididymo-orchitis: Secondary | ICD-10-CM | POA: Diagnosis not present

## 2024-01-30 DIAGNOSIS — K4090 Unilateral inguinal hernia, without obstruction or gangrene, not specified as recurrent: Secondary | ICD-10-CM | POA: Diagnosis not present

## 2024-02-13 ENCOUNTER — Ambulatory Visit: Payer: Self-pay | Admitting: General Surgery

## 2024-02-13 DIAGNOSIS — K4090 Unilateral inguinal hernia, without obstruction or gangrene, not specified as recurrent: Secondary | ICD-10-CM | POA: Diagnosis not present

## 2024-04-17 NOTE — Patient Instructions (Signed)
 SURGICAL WAITING ROOM VISITATION  Patients having surgery or a procedure may have no more than 2 support people in the waiting area - these visitors may rotate.    Children ages 80 and under will not be able to visit patients in [facility] under most circumstances.   Visitors with respiratory illnesses are discouraged from visiting and should remain at home.  If the patient needs to stay at the hospital during part of their recovery, the visitor guidelines for inpatient rooms apply. Pre-op nurse will coordinate an appropriate time for 1 support person to accompany patient in pre-op.  This support person may not rotate.    Please refer to the [facility] website for the visitor guidelines for Inpatients (after your surgery is over and you are in a regular room).       Your procedure is scheduled on: 04/27/2024    Report to [facility] Main Entrance    Report to admitting at   [phone]   Call this number if you have problems the morning of surgery [phone]   Do not eat food :After Midnight.   After Midnight you may have the following liquids until ___ 0645___ AM  DAY OF SURGERY  Water Non-Citrus Juices (without pulp, NO RED-Apple, White grape, White cranberry) Black Coffee (NO MILK/CREAM OR CREAMERS, sugar ok)  Clear Tea (NO MILK/CREAM OR CREAMERS, sugar ok) regular and decaf                             Plain Jell-O (NO RED)                                           Fruit ices (not with fruit pulp, NO RED)                                     Popsicles (NO RED)                                                               Sports drinks like Gatorade (NO RED)                            If you have questions, please contact your surgeons office.       Oral Hygiene is also important to reduce your risk of infection.                                    Remember - BRUSH YOUR TEETH THE MORNING OF SURGERY WITH YOUR REGULAR TOOTHPASTE  DENTURES WILL BE REMOVED  PRIOR TO SURGERY PLEASE DO NOT APPLY Poly grip OR ADHESIVES!!!   Do NOT smoke after Midnight   Stop all vitamins and herbal supplements 7 days before surgery.   Take these medicines the morning of surgery with A SIP OF WATER:  lipitor, protonix    DO NOT TAKE ANY ORAL DIABETIC MEDICATIONS DAY OF YOUR SURGERY  Bring CPAP  mask and tubing day of surgery.                              You may not have any metal on your body including hair pins, jewelry, and body piercing             Do not wear make-up, lotions, powders, perfumes/cologne, or deodorant  Do not wear nail polish including gel and S&S, artificial/acrylic nails, or any other type of covering on natural nails including finger and toenails. If you have artificial nails, gel coating, etc. that needs to be removed by a nail salon please have this removed prior to surgery or surgery may need to be canceled/ delayed if the surgeon/ anesthesia feels like they are unable to be safely monitored.   Do not shave  48 hours prior to surgery.               Men may shave face and neck.   Do not bring valuables to the hospital. [facility] IS NOT             RESPONSIBLE   FOR VALUABLES.   Contacts, glasses, dentures or bridgework may not be worn into surgery.   Bring small overnight bag day of surgery.   DO NOT BRING YOUR HOME MEDICATIONS TO THE HOSPITAL. PHARMACY WILL DISPENSE MEDICATIONS LISTED ON YOUR MEDICATION LIST TO YOU DURING YOUR ADMISSION IN THE HOSPITAL!    Patients discharged on the day of surgery will not be allowed to drive home.  Someone NEEDS to stay with you for the first 24 hours after anesthesia.   Special Instructions: Bring a copy of your healthcare power of attorney and living will documents the day of surgery if you haven't scanned them before.              Please read over the following fact sheets you were given: IF YOU HAVE QUESTIONS ABOUT YOUR PRE-OP INSTRUCTIONS PLEASE CALL [phone].   If you received a  COVID test during your pre-op visit  it is requested that you wear a mask when out in public, stay away from anyone that may not be feeling well and notify your surgeon if you develop symptoms. If you test positive for Covid or have been in contact with anyone that has tested positive in the last 10 days please notify you surgeon.    [place] - Preparing for Surgery Before surgery, you can play an important role.  Because skin is not sterile, your skin needs to be as free of germs as possible.  You can reduce the number of germs on your skin by washing with CHG (chlorahexidine gluconate) soap before surgery.  CHG is an antiseptic cleaner which kills germs and bonds with the skin to continue killing germs even after washing. Please DO NOT use if you have an allergy to CHG or antibacterial soaps.  If your skin becomes reddened/irritated stop using the CHG and inform your nurse when you arrive at Short Stay. Do not shave (including legs and underarms) for at least 48 hours prior to the first CHG shower.  You may shave your face/neck.  Please follow these instructions carefully:  1.  Shower with CHG Soap the night before surgery ONLY (DO NOT USE THE SOAP THE MORNING OF SURGERY).  2.  If you choose to wash your hair, wash your hair first as usual with your normal  shampoo.  3.  After  you shampoo, rinse your hair and body thoroughly to remove the shampoo.                             4.  Use CHG as you would any other liquid soap.  You can apply chg directly to the skin and wash.  Gently with a scrungie or clean washcloth.  5.  Apply the CHG Soap to your body ONLY FROM THE NECK DOWN.   Do   not use on face/ open                           Wound or open sores. Avoid contact with eyes, ears mouth and   genitals (private parts).                       Wash face,  Genitals (private parts) with your normal soap.             6.  Wash thoroughly, paying special attention to the area where your    surgery  will be  performed.  7.  Thoroughly rinse your body with warm water from the neck down.  8.  DO NOT shower/wash with your normal soap after using and rinsing off the CHG Soap.                9.  Pat yourself dry with a clean towel.            10.  Wear clean pajamas.            11.  Place clean sheets on your bed the night of your first shower and do not  sleep with pets. Day of Surgery : Do not apply any CHG, lotions/deodorants the morning of surgery.  Please wear clean clothes to the hospital/surgery center.  FAILURE TO FOLLOW THESE INSTRUCTIONS MAY RESULT IN THE CANCELLATION OF YOUR SURGERY  PATIENT SIGNATURE_________________________________  NURSE SIGNATURE__________________________________  ________________________________________________________________________

## 2024-04-17 NOTE — Progress Notes (Signed)
 Anesthesia Review:  PCP: Cardiologist :  PPM/ ICD: Device Orders: Rep Notified:  Chest x-ray : EKG : Echo : Stress test: Cardiac Cath :   Activity level:  Sleep Study/ CPAP : Fasting Blood Sugar :      / Checks Blood Sugar -- times a day:    Blood Thinner/ Instructions /Last Dose: ASA / Instructions/ Last Dose :    PT states he has called office multiple times and cancelled procedure  He has also LVMM and no one has called him back. PT states procedure cancelled per pt. INfomed pt that preop nurse would call office and inform them per pt.  Proep nurse called and LVMM for nena Sanders at office on 02/27/2024.

## 2024-04-18 ENCOUNTER — Encounter (HOSPITAL_COMMUNITY): Payer: Self-pay

## 2024-04-18 ENCOUNTER — Other Ambulatory Visit: Payer: Self-pay

## 2024-04-18 ENCOUNTER — Encounter (HOSPITAL_COMMUNITY)
Admission: RE | Admit: 2024-04-18 | Discharge: 2024-04-18 | Disposition: A | Source: Ambulatory Visit | Attending: General Surgery

## 2024-04-18 VITALS — BP 165/93 | HR 98 | Temp 98.5°F | Resp 16 | Ht 69.0 in | Wt 161.0 lb

## 2024-04-18 DIAGNOSIS — Z01818 Encounter for other preprocedural examination: Secondary | ICD-10-CM | POA: Insufficient documentation

## 2024-04-18 DIAGNOSIS — I445 Left posterior fascicular block: Secondary | ICD-10-CM | POA: Insufficient documentation

## 2024-04-18 DIAGNOSIS — R000 Tachycardia, unspecified: Secondary | ICD-10-CM | POA: Insufficient documentation

## 2024-04-18 HISTORY — DX: Essential (primary) hypertension: I10

## 2024-04-18 HISTORY — DX: Unspecified osteoarthritis, unspecified site: M19.90

## 2024-04-18 LAB — CBC
HCT: 53.1 % — ABNORMAL HIGH (ref 39.0–52.0)
Hemoglobin: 17.4 g/dL — ABNORMAL HIGH (ref 13.0–17.0)
MCH: 29.9 pg (ref 26.0–34.0)
MCHC: 32.8 g/dL (ref 30.0–36.0)
MCV: 91.4 fL (ref 80.0–100.0)
Platelets: 224 10*3/uL (ref 150–400)
RBC: 5.81 MIL/uL (ref 4.22–5.81)
RDW: 12.1 % (ref 11.5–15.5)
WBC: 5.7 10*3/uL (ref 4.0–10.5)
nRBC: 0 % (ref 0.0–0.2)

## 2024-04-18 LAB — BASIC METABOLIC PANEL WITH GFR
Anion gap: 13 (ref 5–15)
BUN: 9 mg/dL (ref 8–23)
CO2: 26 mmol/L (ref 22–32)
Calcium: 10.1 mg/dL (ref 8.9–10.3)
Chloride: 101 mmol/L (ref 98–111)
Creatinine, Ser: 0.96 mg/dL (ref 0.61–1.24)
GFR, Estimated: >60 mL/min
Glucose, Bld: 109 mg/dL — ABNORMAL HIGH (ref 70–99)
Potassium: 3.9 mmol/L (ref 3.5–5.1)
Sodium: 139 mmol/L (ref 135–145)

## 2024-04-20 ENCOUNTER — Encounter (HOSPITAL_COMMUNITY): Payer: Self-pay
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
## 2024-04-20 NOTE — Anesthesia Preprocedure Evaluation (Signed)
"                                    Anesthesia Evaluation    Airway        Dental   Pulmonary former smoker          Cardiovascular hypertension,      Neuro/Psych    GI/Hepatic   Endo/Other    Renal/GU      Musculoskeletal   Abdominal   Peds  Hematology   Anesthesia Other Findings   Reproductive/Obstetrics                              Anesthesia Physical Anesthesia Plan  ASA:   Anesthesia Plan:    Post-op Pain Management:    Induction:   PONV Risk Score and Plan:   Airway Management Planned:   Additional Equipment:   Intra-op Plan:   Post-operative Plan:   Informed Consent:   Plan Discussed with:   Anesthesia Plan Comments: (See PAT note from 04/19/23)         Anesthesia Quick Evaluation  "

## 2024-04-27 ENCOUNTER — Encounter (HOSPITAL_COMMUNITY): Payer: Self-pay | Admitting: Medical

## 2024-04-27 ENCOUNTER — Ambulatory Visit (HOSPITAL_COMMUNITY): Admission: RE | Admit: 2024-04-27 | Source: Ambulatory Visit | Admitting: General Surgery

## 2024-04-27 ENCOUNTER — Encounter (HOSPITAL_COMMUNITY): Admission: RE | Payer: Self-pay | Source: Ambulatory Visit

## 2024-04-27 SURGERY — REPAIR, HERNIA, INGUINAL, ROBOT-ASSISTED, LAPAROSCOPIC, USING MESH
Anesthesia: General | Laterality: Left
